# Patient Record
Sex: Female | Born: 1945 | Race: White | Hispanic: No | Marital: Married | Smoking: Never smoker
Health system: Southern US, Community
[De-identification: ages and names within clinical notes are randomized; demographics above are authoritative.]

## PROBLEM LIST (undated history)

## (undated) DIAGNOSIS — I639 Cerebral infarction, unspecified: Secondary | ICD-10-CM

## (undated) HISTORY — DX: Cerebral infarction, unspecified: I63.9

---

## 2019-05-24 ENCOUNTER — Inpatient Hospital Stay
Admission: EM | Admit: 2019-05-24 | Discharge: 2019-05-27 | DRG: 065 | Disposition: A | Discharge status: Home / Self Care | Payer: Medicare Other | Attending: Internal Medicine | Admitting: Internal Medicine

## 2019-05-24 ENCOUNTER — Emergency Department: Payer: Medicare Other

## 2019-05-24 ENCOUNTER — Inpatient Hospital Stay: Payer: Medicare Other

## 2019-05-24 ENCOUNTER — Other Ambulatory Visit: Payer: Self-pay

## 2019-05-24 DIAGNOSIS — Z1159 Encounter for screening for other viral diseases: Secondary | ICD-10-CM

## 2019-05-24 DIAGNOSIS — I160 Hypertensive urgency: Secondary | ICD-10-CM | POA: Diagnosis present

## 2019-05-24 DIAGNOSIS — I6381 Other cerebral infarction due to occlusion or stenosis of small artery: Principal | ICD-10-CM | POA: Diagnosis present

## 2019-05-24 DIAGNOSIS — R531 Weakness: Secondary | ICD-10-CM | POA: Diagnosis present

## 2019-05-24 DIAGNOSIS — I119 Hypertensive heart disease without heart failure: Secondary | ICD-10-CM | POA: Diagnosis present

## 2019-05-24 DIAGNOSIS — I739 Peripheral vascular disease, unspecified: Secondary | ICD-10-CM | POA: Diagnosis present

## 2019-05-24 DIAGNOSIS — R739 Hyperglycemia, unspecified: Secondary | ICD-10-CM | POA: Diagnosis present

## 2019-05-24 DIAGNOSIS — I639 Cerebral infarction, unspecified: Secondary | ICD-10-CM

## 2019-05-24 DIAGNOSIS — R29700 NIHSS score 0: Secondary | ICD-10-CM | POA: Diagnosis present

## 2019-05-24 DIAGNOSIS — Z79899 Other long term (current) drug therapy: Secondary | ICD-10-CM | POA: Diagnosis not present

## 2019-05-24 DIAGNOSIS — Z833 Family history of diabetes mellitus: Secondary | ICD-10-CM | POA: Diagnosis not present

## 2019-05-24 DIAGNOSIS — R9431 Abnormal electrocardiogram [ECG] [EKG]: Secondary | ICD-10-CM | POA: Diagnosis not present

## 2019-05-24 DIAGNOSIS — Z8249 Family history of ischemic heart disease and other diseases of the circulatory system: Secondary | ICD-10-CM

## 2019-05-24 DIAGNOSIS — I10 Essential (primary) hypertension: Secondary | ICD-10-CM | POA: Diagnosis present

## 2019-05-24 DIAGNOSIS — G459 Transient cerebral ischemic attack, unspecified: Secondary | ICD-10-CM

## 2019-05-24 DIAGNOSIS — N390 Urinary tract infection, site not specified: Secondary | ICD-10-CM | POA: Diagnosis present

## 2019-05-24 DIAGNOSIS — I447 Left bundle-branch block, unspecified: Secondary | ICD-10-CM | POA: Diagnosis present

## 2019-05-24 LAB — CBC WITH DIFFERENTIAL/PLATELET
Abs Immature Granulocytes: 0.04 10*3/uL (ref 0.00–0.07)
Basophils Absolute: 0.1 10*3/uL (ref 0.0–0.1)
Basophils Relative: 1 %
Eosinophils Absolute: 0.1 10*3/uL (ref 0.0–0.5)
Eosinophils Relative: 1 %
HCT: 47.3 % — ABNORMAL HIGH (ref 36.0–46.0)
Hemoglobin: 15 g/dL (ref 12.0–15.0)
Immature Granulocytes: 0 %
Lymphocytes Relative: 15 %
Lymphs Abs: 1.8 10*3/uL (ref 0.7–4.0)
MCH: 29.6 pg (ref 26.0–34.0)
MCHC: 31.7 g/dL (ref 30.0–36.0)
MCV: 93.3 fL (ref 80.0–100.0)
Monocytes Absolute: 0.8 10*3/uL (ref 0.1–1.0)
Monocytes Relative: 6 %
Neutro Abs: 9.6 10*3/uL — ABNORMAL HIGH (ref 1.7–7.7)
Neutrophils Relative %: 77 %
Platelets: 170 10*3/uL (ref 150–400)
RBC: 5.07 MIL/uL (ref 3.87–5.11)
RDW: 12.9 % (ref 11.5–15.5)
WBC: 12.3 10*3/uL — ABNORMAL HIGH (ref 4.0–10.5)
nRBC: 0 % (ref 0.0–0.2)

## 2019-05-24 LAB — COMPREHENSIVE METABOLIC PANEL
ALT: 12 U/L (ref 0–44)
ALT: 12 U/L (ref 0–44)
AST: 18 U/L (ref 15–41)
AST: 15 U/L (ref 15–41)
Albumin: 4.2 g/dL (ref 3.5–5.0)
Albumin: 3.6 g/dL (ref 3.5–5.0)
Alkaline Phosphatase: 81 U/L (ref 38–126)
Alkaline Phosphatase: 73 U/L (ref 38–126)
Anion gap: 13 (ref 5–15)
Anion gap: 12 (ref 5–15)
BUN: 23 mg/dL (ref 8–23)
BUN: 20 mg/dL (ref 8–23)
CO2: 22 mmol/L (ref 22–32)
CO2: 21 mmol/L — ABNORMAL LOW (ref 22–32)
Calcium: 9.1 mg/dL (ref 8.9–10.3)
Calcium: 8.7 mg/dL — ABNORMAL LOW (ref 8.9–10.3)
Chloride: 104 mmol/L (ref 98–111)
Chloride: 106 mmol/L (ref 98–111)
Creatinine, Ser: 0.84 mg/dL (ref 0.44–1.00)
Creatinine, Ser: 0.81 mg/dL (ref 0.44–1.00)
GFR calc Af Amer: >60 mL/min (ref >60)
GFR calc Af Amer: >60 mL/min (ref >60)
GFR calc non Af Amer: >60 mL/min (ref >60)
GFR calc non Af Amer: >60 mL/min (ref >60)
Glucose, Bld: 116 mg/dL — ABNORMAL HIGH (ref 70–99)
Glucose, Bld: 112 mg/dL — ABNORMAL HIGH (ref 70–99)
Potassium: 3.9 mmol/L (ref 3.5–5.1)
Potassium: 3.9 mmol/L (ref 3.5–5.1)
Sodium: 139 mmol/L (ref 135–145)
Sodium: 139 mmol/L (ref 135–145)
Total Bilirubin: 0.8 mg/dL (ref 0.3–1.2)
Total Bilirubin: 0.7 mg/dL (ref 0.3–1.2)
Total Protein: 8.1 g/dL (ref 6.5–8.1)
Total Protein: 7.1 g/dL (ref 6.5–8.1)

## 2019-05-24 LAB — LIPID PANEL
Cholesterol: 176 mg/dL (ref 0–200)
HDL: 48 mg/dL (ref >40)
LDL Cholesterol: 110 mg/dL — ABNORMAL HIGH (ref 0–99)
Total CHOL/HDL Ratio: 3.7 RATIO
Triglycerides: 92 mg/dL (ref <150)
VLDL: 18 mg/dL (ref 0–40)

## 2019-05-24 LAB — URINALYSIS, COMPLETE (UACMP) WITH MICROSCOPIC
Bacteria, UA: NONE SEEN
Bilirubin Urine: NEGATIVE
Glucose, UA: NEGATIVE mg/dL
Ketones, ur: NEGATIVE mg/dL
Nitrite: NEGATIVE
Protein, ur: NEGATIVE mg/dL
Specific Gravity, Urine: 1.017 (ref 1.005–1.030)
pH: 5 (ref 5.0–8.0)

## 2019-05-24 LAB — PROTIME-INR
INR: 0.9 (ref 0.8–1.2)
Prothrombin Time: 12.3 s (ref 11.4–15.2)

## 2019-05-24 LAB — TSH: TSH: 1.448 u[IU]/mL (ref 0.350–4.500)

## 2019-05-24 LAB — TROPONIN I (HIGH SENSITIVITY)
Troponin I (High Sensitivity): 10 ng/L (ref <18)
Troponin I (High Sensitivity): 15 ng/L (ref <18)
Troponin I (High Sensitivity): 15 ng/L (ref <18)

## 2019-05-24 LAB — APTT: aPTT: 31 seconds (ref 24–36)

## 2019-05-24 MED ORDER — ASPIRIN EC 81 MG PO TBEC
81.0000 mg | DELAYED_RELEASE_TABLET | Freq: Every day | ORAL | Status: DC
  Administered 2019-05-24 – 2019-05-27 (×4): 81 mg via ORAL
  Filled 2019-05-24 (×4): qty 1

## 2019-05-24 MED ORDER — ENOXAPARIN SODIUM 40 MG/0.4ML ~~LOC~~ SOLN
40.0000 mg | SUBCUTANEOUS | Status: DC
  Administered 2019-05-24 – 2019-05-27 (×4): 40 mg via SUBCUTANEOUS
  Filled 2019-05-24 (×4): qty 0.4

## 2019-05-24 MED ORDER — SODIUM CHLORIDE 0.9% FLUSH: 3.0000 mL | INTRAVENOUS | Status: DC | PRN

## 2019-05-24 MED ORDER — ACETAMINOPHEN 650 MG RE SUPP: 650.0000 mg | Freq: Four times a day (QID) | RECTAL | Status: DC | PRN

## 2019-05-24 MED ORDER — SODIUM CHLORIDE 0.9% FLUSH
3.0000 mL | Freq: Two times a day (BID) | INTRAVENOUS | Status: DC
  Administered 2019-05-24 – 2019-05-27 (×5): 3 mL via INTRAVENOUS

## 2019-05-24 MED ORDER — ACETAMINOPHEN 325 MG PO TABS: 650.0000 mg | ORAL_TABLET | Freq: Four times a day (QID) | ORAL | Status: DC | PRN

## 2019-05-24 MED ORDER — LORAZEPAM 2 MG/ML IJ SOLN
1.0000 mg | Freq: Four times a day (QID) | INTRAMUSCULAR | Status: DC | PRN
  Administered 2019-05-24: 1 mg via INTRAVENOUS
  Filled 2019-05-24: qty 1

## 2019-05-24 MED ORDER — ONDANSETRON HCL 4 MG PO TABS: 4.0000 mg | ORAL_TABLET | Freq: Four times a day (QID) | ORAL | Status: DC | PRN

## 2019-05-24 MED ORDER — LORAZEPAM 2 MG/ML IJ SOLN
1.0000 mg | Freq: Once | INTRAMUSCULAR | Status: AC
  Administered 2019-05-24: 1 mg via INTRAVENOUS
  Filled 2019-05-24: qty 1

## 2019-05-24 MED ORDER — SODIUM CHLORIDE 0.9 % IV SOLN: 250.0000 mL | INTRAVENOUS | Status: DC | PRN

## 2019-05-24 MED ORDER — SODIUM CHLORIDE 0.9 % IV SOLN
1.0000 g | INTRAVENOUS | Status: DC
  Administered 2019-05-24: 1 g via INTRAVENOUS
  Filled 2019-05-24 (×2): qty 10

## 2019-05-24 MED ORDER — ONDANSETRON HCL 4 MG/2ML IJ SOLN: 4.0000 mg | Freq: Four times a day (QID) | INTRAMUSCULAR | Status: DC | PRN

## 2019-05-24 MED ORDER — HYDRALAZINE HCL 20 MG/ML IJ SOLN
10.0000 mg | Freq: Four times a day (QID) | INTRAMUSCULAR | Status: DC | PRN
  Filled 2019-05-24: qty 1

## 2019-05-24 MED ORDER — METOPROLOL TARTRATE 25 MG PO TABS
25.0000 mg | ORAL_TABLET | Freq: Two times a day (BID) | ORAL | Status: DC
  Administered 2019-05-24 – 2019-05-27 (×6): 25 mg via ORAL
  Filled 2019-05-24 (×6): qty 1

## 2019-05-24 MED ORDER — LABETALOL HCL 5 MG/ML IV SOLN
5.0000 mg | Freq: Once | INTRAVENOUS | Status: AC
  Administered 2019-05-24: 5 mg via INTRAVENOUS
  Filled 2019-05-24: qty 4

## 2019-05-24 MED ORDER — POLYETHYLENE GLYCOL 3350 17 G PO PACK: 17.0000 g | PACK | Freq: Every day | ORAL | Status: DC | PRN

## 2019-05-24 MED ORDER — HYDROCODONE-ACETAMINOPHEN 5-325 MG PO TABS: 1.0000 | ORAL_TABLET | ORAL | Status: DC | PRN

## 2019-05-24 NOTE — Progress Notes (Signed)
Family Meeting Note  Advance Directive:no  Today a meeting took place with the Patient.   The following clinical team members were present during this meeting:MD  The following were discussed:Patient's diagnosis: LBBB Weakness Accelerated HTN  , Patient's progosis: > 12 months and Goals for treatment: Full Code  Additional follow-up to be provided: chaplain consult ot create AD  Time spent during discussion:16 minutes  Bettey Costa, MD

## 2019-05-24 NOTE — ED Notes (Signed)
ED TO INPATIENT HANDOFF REPORT  ED Nurse Name and Phone #:    S Name/Age/Gender Roberta Nunez 73 y.o. female Room/Bed: ED16A/ED16A  Code Status   Code Status: Full Code  Home/SNF/Other Home Patient oriented to: self, place, time and situation Is this baseline? Yes   Triage Complete: Triage complete  Chief Complaint hypertension;weakness  Triage Note Pt comes via POV from home with c/o hypertension and weakness. Pt states that this past Friday night she had an episode of weakness to right leg and right arm. Pt states it then subsided.  Pt states that Saturday she felt weak and has since just not felt right. Pt states she was told by PCP to come to walk in and then was sent here from walk-in clinic.  Pt states she has just been feeling so weak.  Pt is alert and oriented. Mini neuro negative. No slurred speech, headache.   Allergies Not on File  Level of Care/Admitting Diagnosis ED Disposition    ED Disposition Condition [facility] Area: [place] [[id number]]  Level of Care: Telemetry [5]  Covid Evaluation: N/A  Diagnosis: HTN (hypertension) [878676]  Admitting Physician: Bettey Costa [[id number]]  Attending Physician: MODY, Ulice Bold [[id number]]  Estimated length of stay: past midnight tomorrow  Certification:: I certify this patient will need inpatient services for at least 2 midnights  PT Class (Do Not Modify): Inpatient [101]  PT Acc Code (Do Not Modify): Private [1]       B Medical/Surgery History History reviewed. No pertinent past medical history. History reviewed. No pertinent surgical history.   A IV Location/Drains/Wounds Patient Lines/Drains/Airways Status   Active Line/Drains/Airways    Name:   Placement date:   Placement time:   Site:   Days:   Peripheral IV 05/24/19 Right Antecubital   05/24/19    1507    Antecubital   less than 1          Intake/Output Last 24 hours No intake or output data in the 24 hours  ending 05/24/19 1727  Labs/Imaging Results for orders placed or performed during the hospital encounter of 05/24/19 (from the past 48 hour(s))  Troponin I (High Sensitivity)     Status: None   Collection Time: 05/24/19  3:01 PM  Result Value Ref Range   Troponin I (High Sensitivity) 10 <18 ng/L    Comment: (NOTE) Elevated high sensitivity troponin I (hsTnI) values and significant  changes across serial measurements may suggest ACS but many other  chronic and acute conditions are known to elevate hsTnI results.  Refer to the "Links" section for chest pain algorithms and additional  guidance. Performed at [facility], [address] [place], [place] [address]   Comprehensive metabolic panel     Status: Abnormal   Collection Time: 05/24/19  3:01 PM  Result Value Ref Range   Sodium 139 135 - 145 mmol/L   Potassium 3.9 3.5 - 5.1 mmol/L   Chloride 104 98 - 111 mmol/L   CO2 22 22 - 32 mmol/L   Glucose, Bld 116 (H) 70 - 99 mg/dL   BUN 23 8 - 23 mg/dL   Creatinine, Ser 0.84 0.44 - 1.00 mg/dL   Calcium 9.1 8.9 - 10.3 mg/dL   Total Protein 8.1 6.5 - 8.1 g/dL   Albumin 4.2 3.5 - 5.0 g/dL   AST 18 15 - 41 U/L   ALT 12 0 - 44 U/L   Alkaline Phosphatase 81 38 - 126  U/L   Total Bilirubin 0.8 0.3 - 1.2 mg/dL   GFR calc non Af Amer >60 >60 mL/min   GFR calc Af Amer >60 >60 mL/min   Anion gap 13 5 - 15    Comment: Performed at [facility], [address] [place], [place] [address]  CBC with Differential     Status: Abnormal   Collection Time: 05/24/19  3:01 PM  Result Value Ref Range   WBC 12.3 (H) 4.0 - 10.5 K/uL   RBC 5.07 3.87 - 5.11 MIL/uL   Hemoglobin 15.0 12.0 - 15.0 g/dL   HCT 19.147.3 (H) 47.836.0 - 29.546.0 %   MCV 93.3 80.0 - 100.0 fL   MCH 29.6 26.0 - 34.0 pg   MCHC 31.7 30.0 - 36.0 g/dL   RDW 62.112.9 30.811.5 - 65.715.5 %   Platelets 170 150 - 400 K/uL   nRBC 0.0 0.0 - 0.2 %   Neutrophils Relative % 77 %   Neutro Abs 9.6 (H) 1.7 - 7.7 K/uL   Lymphocytes Relative 15 %    Lymphs Abs 1.8 0.7 - 4.0 K/uL   Monocytes Relative 6 %   Monocytes Absolute 0.8 0.1 - 1.0 K/uL   Eosinophils Relative 1 %   Eosinophils Absolute 0.1 0.0 - 0.5 K/uL   Basophils Relative 1 %   Basophils Absolute 0.1 0.0 - 0.1 K/uL   Immature Granulocytes 0 %   Abs Immature Granulocytes 0.04 0.00 - 0.07 K/uL    Comment: Performed at [facility], [address] [place], [place] [address]  Urinalysis, Complete w Microscopic     Status: Abnormal   Collection Time: 05/24/19  3:01 PM  Result Value Ref Range   Color, Urine YELLOW (A) YELLOW   APPearance HAZY (A) CLEAR   Specific Gravity, Urine 1.017 1.005 - 1.030   pH 5.0 5.0 - 8.0   Glucose, UA NEGATIVE NEGATIVE mg/dL   Hgb urine dipstick SMALL (A) NEGATIVE   Bilirubin Urine NEGATIVE NEGATIVE   Ketones, ur NEGATIVE NEGATIVE mg/dL   Protein, ur NEGATIVE NEGATIVE mg/dL   Nitrite NEGATIVE NEGATIVE   Leukocytes,Ua MODERATE (A) NEGATIVE   RBC / HPF 0-5 0 - 5 RBC/hpf   WBC, UA 21-50 0 - 5 WBC/hpf   Bacteria, UA NONE SEEN NONE SEEN   Squamous Epithelial / LPF 6-10 0 - 5   Mucus PRESENT     Comment: Performed at [facility], [address] [place], [place] [address]  Protime-INR     Status: None   Collection Time: 05/24/19  3:01 PM  Result Value Ref Range   Prothrombin Time 12.3 11.4 - 15.2 seconds   INR 0.9 0.8 - 1.2    Comment: (NOTE) INR goal varies based on device and disease states. Performed at [facility], [address] [place], [place] [address]   APTT     Status: None   Collection Time: 05/24/19  3:01 PM  Result Value Ref Range   aPTT 31 24 - 36 seconds    Comment: Performed at [facility], [address] [place], [place] [address]   Dg Chest 2 View  Result Date: 05/24/2019 CLINICAL DATA:  Weakness. EXAM: CHEST - 2 VIEW COMPARISON:  None. FINDINGS: The heart size and mediastinal contours are within normal limits. Both lungs are clear. The visualized skeletal structures are  unremarkable. IMPRESSION: No active cardiopulmonary disease. Electronically Signed   By: Lupita RaiderJames  Green Jr M.D.   On: 05/24/2019 16:23  Ct Head Wo Contrast  Result Date: 05/24/2019 CLINICAL DATA:  Right arm and leg heaviness x 5 days EXAM: CT HEAD WITHOUT CONTRAST TECHNIQUE: Contiguous axial images were obtained from the base of the skull through the vertex without intravenous contrast. COMPARISON:  None. FINDINGS: Brain: No evidence of acute infarction, hemorrhage, hydrocephalus, extra-axial collection or mass lesion/mass effect. Periventricular white matter low attenuation as can be seen with microvascular disease. Vascular: No hyperdense vessel or unexpected calcification. Skull: No osseous abnormality. Sinuses/Orbits: Visualized paranasal sinuses are clear. Visualized mastoid sinuses are clear. Visualized orbits demonstrate no focal abnormality. Other: None IMPRESSION: No acute intracranial pathology. Electronically Signed   By: Elige KoHetal  Patel   On: 05/24/2019 16:52    Pending Labs Unresulted Labs (From admission, onward)    Start     Ordered   05/31/19 0500  Creatinine, serum  (enoxaparin (LOVENOX)    CrCl >/= 30 ml/min)  Weekly,   STAT    Comments: while on enoxaparin therapy    05/24/19 1711   05/25/19 0500  Basic metabolic panel  Tomorrow morning,   STAT     05/24/19 1711   05/24/19 1712  CBC  (enoxaparin (LOVENOX)    CrCl >/= 30 ml/min)  Once,   STAT    Comments: Baseline for enoxaparin therapy IF NOT ALREADY DRAWN.  Notify MD if PLT < 100 K.    05/24/19 1711   05/24/19 1712  Creatinine, serum  (enoxaparin (LOVENOX)    CrCl >/= 30 ml/min)  Once,   STAT    Comments: Baseline for enoxaparin therapy IF NOT ALREADY DRAWN.    05/24/19 1711   05/24/19 1712  Lipid panel  Once,   STAT     05/24/19 1711   05/24/19 1712  TSH  Once,   STAT     05/24/19 1711   05/24/19 1552  Novel Coronavirus,NAA,(SEND-OUT TO REF LAB - TAT 24-48 hrs); Hosp Order  (Asymptomatic Patients Labs)  Once,   STAT     Question:  Rule Out  Answer:  Yes   05/24/19 1552   05/24/19 1545  Urine culture  ONCE - STAT,   STAT     05/24/19 1545   05/24/19 1500  CBC with Differential/Platelet  Once,   R     05/24/19 1500   05/24/19 1500  Comprehensive metabolic panel  Once,   R     14/78/2906/30/20 1500   05/24/19 1500  Novel Coronavirus, NAA (hospital order; send-out to ref lab)  Once,   R     05/24/19 1500   05/24/19 1500  Protime-INR  Once,   R     05/24/19 1500   05/24/19 1500  APTT  Once,   R     05/24/19 1500   05/24/19 1500  Troponin I (High Sensitivity)  Once,   R     05/24/19 1500   05/24/19 1500  Urinalysis, Routine w reflex microscopic  Once,   R     05/24/19 1500   05/24/19 1500  Urine Culture  Once,   R     05/24/19 1500   05/24/19 1441  Troponin I (High Sensitivity)  STAT Now then every 2 hours,   STAT    Question Answer Comment  Indication Other   Specify indication weakness      05/24/19 1441          Vitals/Pain Today's Vitals   05/24/19 1530 05/24/19 1600 05/24/19 1630 05/24/19 1700  BP: (!) 186/92 (!) 178/91 Marland Kitchen(!)  170/81 (!) 166/91  Pulse: (!) 109 (!) 102 (!) 101 (!) 101  Resp: 19 (!) 21 19 20   Temp:      TempSrc:      SpO2: 96% 95% 96% 95%  Weight:      Height:      PainSc:        Isolation Precautions No active isolations  Medications Medications  metoprolol tartrate (LOPRESSOR) tablet 25 mg (has no administration in time range)  hydrALAZINE (APRESOLINE) injection 10 mg (has no administration in time range)  enoxaparin (LOVENOX) injection 40 mg (has no administration in time range)  sodium chloride flush (NS) 0.9 % injection 3 mL (has no administration in time range)  0.9 %  sodium chloride infusion (has no administration in time range)  sodium chloride flush (NS) 0.9 % injection 3 mL (has no administration in time range)  acetaminophen (TYLENOL) tablet 650 mg (has no administration in time range)    Or  acetaminophen (TYLENOL) suppository 650 mg (has no administration  in time range)  HYDROcodone-acetaminophen (NORCO/VICODIN) 5-325 MG per tablet 1-2 tablet (has no administration in time range)  polyethylene glycol (MIRALAX / GLYCOLAX) packet 17 g (has no administration in time range)  ondansetron (ZOFRAN) tablet 4 mg (has no administration in time range)    Or  ondansetron (ZOFRAN) injection 4 mg (has no administration in time range)  aspirin EC tablet 81 mg (has no administration in time range)  LORazepam (ATIVAN) injection 1 mg (has no administration in time range)  cefTRIAXone (ROCEPHIN) 1 g in sodium chloride 0.9 % 100 mL IVPB (1 g Intravenous New Bag/Given 05/24/19 1703)  labetalol (NORMODYNE) injection 5 mg (5 mg Intravenous Given 05/24/19 1534)  LORazepam (ATIVAN) injection 1 mg (1 mg Intravenous Given 05/24/19 1556)    Mobility walks Low fall risk   Focused Assessments Neuro Assessment Handoff:  Swallow screen pass? Yes  Cardiac Rhythm: Sinus tachycardia NIH Stroke Scale ( + Modified Stroke Scale Criteria)  LOC Questions (1b. )   +: Answers both questions correctly LOC Commands (1c. )   + : Performs both tasks correctly Best Gaze (2. )  +: Normal Visual (3. )  +: No visual loss Motor Arm, Left (5a. )   +: No drift Motor Arm, Right (5b. )   +: No drift Motor Leg, Left (6a. )   +: No drift Motor Leg, Right (6b. )   +: No drift Sensory (8. )   +: Normal, no sensory loss Best Language (9. )   +: No aphasia Extinction/Inattention (11.)   +: No Abnormality Modified SS Total  +: 0     Neuro Assessment:   Neuro Checks:      Last Documented NIHSS Modified Score: 0 (05/24/19 1546) Has TPA been given? No   If patient is a Neuro Trauma and patient is going to OR before floor call report to 4N Charge nurse: [phone] or [phone]     R Recommendations: See Admitting Provider Note  Report given to:   Additional Notes:

## 2019-05-24 NOTE — ED Notes (Signed)
Patient reports he pressure is high, reports has never taken b/p meds nor was she ever perscribed medications from her pcp. States has pressure was always high with all her visits to pcp.

## 2019-05-24 NOTE — ED Notes (Signed)
  EKG taken to MD by Dorian and informed to bring pt back to room 16. No orders or lab completed at this time. Dorian to inform RN

## 2019-05-24 NOTE — ED Notes (Signed)
DR. Lisbeth Renshaw at bedside to assess and discuss plan of care during admission.

## 2019-05-24 NOTE — H&P (Signed)
[place] [facility] [place] NAME: Reann Dobias    MR#:  [id number]  DATE OF BIRTH:  21-Jun-1946  DATE OF ADMISSION:  05/24/2019  PRIMARY CARE PHYSICIAN: Patient, No Pcp Per   REQUESTING/REFERRING PHYSICIAN: dr Corky Downs  CHIEF COMPLAINT:   weakness HISTORY OF PRESENT ILLNESS:  Dicie Edelen  is a 73 y.o. female with no past medical history who presents from home due to elevated blood pressure and generalized weakness.  Patient reports that Friday she had an episode of right leg weakness and right arm weakness.  It subsided fairly shortly approximately 5 minutes.  She had no other focal neurological deficits.  Patient reports that she just did not feel well over the weekend.  She presents today due to the symptoms.  In the emergency room she is noted to have a new left bundle branch block and elevated blood pressure systolic in the 151V.  She reports she does not have a history of hypertension nor has she taken any medications. She denies fever, chills, chest pain, PND or shortness of breath. PAST MEDICAL HISTORY:  None  PAST SURGICAL HISTORY:  None  SOCIAL HISTORY:  Tobacco or EtOH abuse  FAMILY HISTORY:  Positive hypertension  DRUG ALLERGIES:  Drug allergies  REVIEW OF SYSTEMS:   Review of Systems  Constitutional: Positive for malaise/fatigue. Negative for chills and fever.  HENT: Negative.  Negative for ear discharge, ear pain, hearing loss, nosebleeds and sore throat.   Eyes: Negative.  Negative for blurred vision and pain.  Respiratory: Negative.  Negative for cough, hemoptysis, shortness of breath and wheezing.   Cardiovascular: Negative.  Negative for chest pain, palpitations and leg swelling.  Gastrointestinal: Negative.  Negative for abdominal pain, blood in stool, diarrhea, nausea and vomiting.  Genitourinary: Negative.  Negative for dysuria.  Musculoskeletal: Negative.  Negative for back pain.  Skin: Negative.   Neurological:  Positive for focal weakness. Negative for dizziness, tremors, speech change, seizures and headaches.  Endo/Heme/Allergies: Negative.  Does not bruise/bleed easily.  Psychiatric/Behavioral: Negative.  Negative for depression, hallucinations and suicidal ideas.    MEDICATIONS AT HOME:   Prior to Admission medications   Not on File      VITAL SIGNS:  Blood pressure (!) 178/91, pulse (!) 102, temperature 99.5 F (37.5 C), temperature source Oral, resp. rate (!) 21, height 5' (1.524 m), weight 67.6 kg, SpO2 95 %.  PHYSICAL EXAMINATION:   Physical Exam Constitutional:      General: She is not in acute distress. HENT:     Head: Normocephalic.  Eyes:     General: No scleral icterus. Neck:     Musculoskeletal: Normal range of motion and neck supple.     Vascular: No JVD.     Trachea: No tracheal deviation.  Cardiovascular:     Rate and Rhythm: Normal rate and regular rhythm.     Heart sounds: Normal heart sounds. No murmur. No friction rub. No gallop.   Pulmonary:     Effort: Pulmonary effort is normal. No respiratory distress.     Breath sounds: Normal breath sounds. No wheezing or rales.  Chest:     Chest wall: No tenderness.  Abdominal:     General: Bowel sounds are normal. There is no distension.     Palpations: Abdomen is soft. There is no mass.     Tenderness: There is no abdominal tenderness. There is no guarding or rebound.  Musculoskeletal: Normal range of motion.  Skin:  General: Skin is warm.     Findings: No erythema or rash.  Neurological:     General: No focal deficit present.     Mental Status: She is alert and oriented to person, place, and time.  Psychiatric:        Mood and Affect: Mood normal.        Behavior: Behavior normal.        Thought Content: Thought content normal.        Judgment: Judgment normal.       LABORATORY PANEL:   CBC Recent Labs  Lab 05/24/19 1501  WBC 12.3*  HGB 15.0  HCT 47.3*  PLT 170    ------------------------------------------------------------------------------------------------------------------  Chemistries  Recent Labs  Lab 05/24/19 1501  NA 139  K 3.9  CL 104  CO2 22  GLUCOSE 116*  BUN 23  CREATININE 0.84  CALCIUM 9.1  AST 18  ALT 12  ALKPHOS 81  BILITOT 0.8   ------------------------------------------------------------------------------------------------------------------  Cardiac Enzymes No results for input(s): TROPONINI in the last 168 hours. ------------------------------------------------------------------------------------------------------------------  RADIOLOGY:  Dg Chest 2 View  Result Date: 05/24/2019 CLINICAL DATA:  Weakness. EXAM: CHEST - 2 VIEW COMPARISON:  None. FINDINGS: The heart size and mediastinal contours are within normal limits. Both lungs are clear. The visualized skeletal structures are unremarkable. IMPRESSION: No active cardiopulmonary disease. Electronically Signed   By: Lupita RaiderJames  Green Jr M.D.   On: 05/24/2019 16:23    EKG:  Sinus tachycardia left bundle branch block no ST elevation  IMPRESSION AND PLAN:   73 year old female with no past medical history who presents with generalized weakness.  1.  Accelerated hypertension: Start metoprolol 25 p.o. twice daily Order echocardiogram PRN hydralazine Monitor blood pressure TSH 2.  New left bundle branch block[facility] via epic Monitor in telemetry Follow troponins  3.  Hyperglycemia: Repeat BMP in a.m.  If still elevated then consider A1c  4.  UTI: Start Rocephin and follow-up on urine culture.  5.  Transient Right leg and arm weakness which has resolved: This is likely due to elevated blood pressure however I will order MRI of the brain to evaluate for underlying CVA and carotid Doppler.   All the records are reviewed and case discussed with ED provider. Management plans discussed with the patient and she is in agreement  CODE  STATUS: Full  TOTAL TIME TAKING CARE OF THIS PATIENT: 42 minutes.    Adrian SaranSital Annise Boran M.D on 05/24/2019 at 4:50 PM  Between 7am to 6pm - Pager - [phone]  After 6pm go to [email or web address] - password [email or web address]  [facility]  Office  [phone]  CC: Primary care physician; Patient, No Pcp Per

## 2019-05-24 NOTE — ED Provider Notes (Signed)
[facility] Emergency Department Provider Note  ____________________________________________   First MD Initiated Contact with Patient 05/24/19 1503     (approximate)  I have reviewed the triage vital signs and the nursing notes.   HISTORY  Chief Complaint Weakness and Hypertension   HPI Roberta Nunez is a 73 y.o. female who presents to the emergency department for treatment and evaluation of hypertension, weakness and generalized malaise. Symptoms started 5 days ago. She states that for a few hours her right arm and leg felt "heavy."  This lasted for a few hours and then resolved. Since that time she has just felt tired and weak. She has had a headache in the mornings off and on for quite a while, but they typically resolve with tylenol and a shower. She denies chest pain or shortness of breath.       History reviewed. No pertinent past medical history.  Patient Active Problem List   Diagnosis Date Noted  . HTN (hypertension) 05/24/2019    History reviewed. No pertinent surgical history.  Prior to Admission medications   Medication Sig Start Date End Date Taking? Authorizing Provider  acetaminophen (TYLENOL) 325 MG tablet Take 650 mg by mouth every 6 (six) hours as needed.   Yes [provider]    Allergies Patient has no allergy information on record.  No family history on file.  Social History Social History   Tobacco Use  . Smoking status: Never Smoker  . Smokeless tobacco: Never Used  Substance Use Topics  . Alcohol use: Not on file  . Drug use: Not on file    Review of Systems  Constitutional: No fever/chills. Positive for generalized weakness. Eyes: No visual changes. ENT: No sore throat. Cardiovascular: Denies chest pain. Respiratory: Denies shortness of breath. Gastrointestinal: No abdominal pain.  No nausea, no vomiting.  No diarrhea.  No constipation. Genitourinary: Negative for dysuria. Musculoskeletal:  Negative for back pain. Skin: Negative for rash. Neurological: Negative for headaches, focal weakness or numbness. ____________________________________________   PHYSICAL EXAM:  VITAL SIGNS: ED Triage Vitals  Enc Vitals Group     BP 05/24/19 1402 (!) 205/92     Pulse Rate 05/24/19 1402 (!) 116     Resp 05/24/19 1402 18     Temp 05/24/19 1402 99.5 F (37.5 C)     Temp Source 05/24/19 1402 Oral     SpO2 05/24/19 1402 95 %     Weight 05/24/19 1402 149 lb (67.6 kg)     Height 05/24/19 1402 5' (1.524 m)     Head Circumference --      Peak Flow --      Pain Score 05/24/19 1507 0     Pain Loc --      Pain Edu? --      Excl. in [place]? --     Constitutional: Alert and oriented. Well appearing and in no acute distress. Eyes: Conjunctivae are normal. PERRL. EOMI. Head: Atraumatic. Nose: No congestion/rhinnorhea. Mouth/Throat: Mucous membranes are moist.  Oropharynx non-erythematous. Neck: No stridor.   Cardiovascular: Normal rate, regular rhythm. Grossly normal heart sounds.  Good peripheral circulation. Respiratory: Normal respiratory effort.  No retractions. Lungs CTAB. Gastrointestinal: Soft and nontender. No distention. No abdominal bruits. No CVA tenderness. Musculoskeletal: No lower extremity tenderness nor edema.  No joint effusions. Neurologic:  Normal speech and language. No gross focal neurologic deficits are appreciated. Tongue protrudes midline, equal shoulder shrug, no arm drift, able to perform straight leg raise without assistance.  No gait instability. Skin:  Skin is warm, dry and intact. No rash noted. Psychiatric: Mood and affect are normal. Speech and behavior are normal.  ____________________________________________   LABS (all labs ordered are listed, but only abnormal results are displayed)  Labs Reviewed  COMPREHENSIVE METABOLIC PANEL - Abnormal; Notable for the following components:      Result Value   Glucose, Bld 116 (*)    All other components within  normal limits  CBC WITH DIFFERENTIAL/PLATELET - Abnormal; Notable for the following components:   WBC 12.3 (*)    HCT 47.3 (*)    Neutro Abs 9.6 (*)    All other components within normal limits  URINALYSIS, COMPLETE (UACMP) WITH MICROSCOPIC - Abnormal; Notable for the following components:   Color, Urine YELLOW (*)    APPearance HAZY (*)    Hgb urine dipstick SMALL (*)    Leukocytes,Ua MODERATE (*)    All other components within normal limits  COMPREHENSIVE METABOLIC PANEL - Abnormal; Notable for the following components:   CO2 21 (*)    Glucose, Bld 112 (*)    Calcium 8.7 (*)    All other components within normal limits  LIPID PANEL - Abnormal; Notable for the following components:   LDL Cholesterol 110 (*)    All other components within normal limits  URINE CULTURE  NOVEL CORONAVIRUS, NAA (HOSPITAL ORDER, SEND-OUT TO REF LAB)  NOVEL CORONAVIRUS, NAA (HOSPITAL ORDER, SEND-OUT TO REF LAB)  URINE CULTURE  TROPONIN I (HIGH SENSITIVITY)  TROPONIN I (HIGH SENSITIVITY)  PROTIME-INR  APTT  TROPONIN I (HIGH SENSITIVITY)  TSH  CBC WITH DIFFERENTIAL/PLATELET  BASIC METABOLIC PANEL   ____________________________________________  EKG  ED ECG REPORT I, Anani Gu, FNP-BC personally viewed and interpreted this ECG.   Date: 05/24/2019  EKG Time: 1408  Rate: 115  Rhythm: sinus tachycardia  Axis: left  Intervals:left bundle branch block  ST&T Change: LBBB, no ST elevation.  ____________________________________________  RADIOLOGY  ED MD interpretation:    CT Head without contrast is negative for acute abnormalities.  Chest x-ray is negative for acute cardiopulmonary abnormality.  Official radiology report(s): Dg Chest 2 View  Result Date: 05/24/2019 CLINICAL DATA:  Weakness. EXAM: CHEST - 2 VIEW COMPARISON:  None. FINDINGS: The heart size and mediastinal contours are within normal limits. Both lungs are clear. The visualized skeletal structures are unremarkable.  IMPRESSION: No active cardiopulmonary disease. Electronically Signed   By: Lupita RaiderJames  Green Jr M.D.   On: 05/24/2019 16:23   Ct Head Wo Contrast  Result Date: 05/24/2019 CLINICAL DATA:  Right arm and leg heaviness x 5 days EXAM: CT HEAD WITHOUT CONTRAST TECHNIQUE: Contiguous axial images were obtained from the base of the skull through the vertex without intravenous contrast. COMPARISON:  None. FINDINGS: Brain: No evidence of acute infarction, hemorrhage, hydrocephalus, extra-axial collection or mass lesion/mass effect. Periventricular white matter low attenuation as can be seen with microvascular disease. Vascular: No hyperdense vessel or unexpected calcification. Skull: No osseous abnormality. Sinuses/Orbits: Visualized paranasal sinuses are clear. Visualized mastoid sinuses are clear. Visualized orbits demonstrate no focal abnormality. Other: None IMPRESSION: No acute intracranial pathology. Electronically Signed   By: Elige KoHetal  Patel   On: 05/24/2019 16:52    ____________________________________________   PROCEDURES  Procedure(s) performed (including Critical Care):  Procedures   ____________________________________________   INITIAL IMPRESSION / ASSESSMENT AND PLAN / ED COURSE  As part of my medical decision making, I reviewed the following data within the electronic MEDICAL RECORD NUMBER Evaluated by EM attending  Dr. Roxan Hockeyobinson.      73 year old female presenting to the emergency department several days after experiencing heaviness and weakness in her right upper and lower extremity.  That has since resolved, but states that she has just felt tired and not like herself since.  Also, she is noted to be hypertensive and ECG shows a left bundle branch block with sinus tachycardia.  There are no previous EKGs to review.  She states that she has not seen her primary care provider in a few years.  She has been told in the past that she is hypertensive, but has never been given any antihypertensive  medications.  ----------------------------------------- 3:52 PM on 05/24/2019 -----------------------------------------  Plan will be to admit the patient for stroke/TIA work-up.  5 mg of labetalol was given IV with slight decrease in blood pressure and decrease in heart rate.  Patient states that her headache has lessened.  She does complain of anxiety and Ativan has been ordered.  Plan for admission was discussed with the patient.  She is agreeable.  Awaiting CT results and chest x-ray results, then will discuss with hospitalist.   ----------------------------------------- 4:42 PM on 05/24/2019 -----------------------------------------  CT of the head is normal. Patient to be admitted. Care relinquished to Dr. Juliene PinaMody.     ____________________________________________   FINAL CLINICAL IMPRESSION(S) / ED DIAGNOSES  Final diagnoses:  Hypertensive urgency  TIA (transient ischemic attack)  New onset left bundle branch block (LBBB)     ED Discharge Orders    None       Note:  This document was prepared using Dragon voice recognition software and may include unintentional dictation errors.    Chinita Pesterriplett, Elley Harp B, FNP 05/24/19 2034    Willy Eddyobinson, Patrick, MD 05/24/19 [phone]    Willy Eddyobinson, Patrick, MD 05/24/19 [phone]

## 2019-05-24 NOTE — ED Notes (Signed)
covid swab obtained and sent

## 2019-05-24 NOTE — ED Triage Notes (Signed)
Pt comes via POV from home with c/o hypertension and weakness. Pt states that this past Friday night she had an episode of weakness to right leg and right arm. Pt states it then subsided.  Pt states that Saturday she felt weak and has since just not felt right. Pt states she was told by PCP to come to walk in and then was sent here from walk-in clinic.  Pt states she has just been feeling so weak.  Pt is alert and oriented. Mini neuro negative. No slurred speech, headache.

## 2019-05-24 NOTE — ED Notes (Signed)
Awaiting md eval

## 2019-05-24 NOTE — ED Notes (Signed)
MRI screen completed.

## 2019-05-25 ENCOUNTER — Inpatient Hospital Stay: Payer: Medicare Other

## 2019-05-25 ENCOUNTER — Encounter: Payer: Self-pay | Admitting: Internal Medicine

## 2019-05-25 ENCOUNTER — Inpatient Hospital Stay (HOSPITAL_COMMUNITY)
Admit: 2019-05-25 | Discharge: 2019-05-25 | Disposition: A | Discharge status: Home / Self Care | Payer: Medicare Other | Attending: Internal Medicine | Admitting: Internal Medicine

## 2019-05-25 DIAGNOSIS — I639 Cerebral infarction, unspecified: Secondary | ICD-10-CM

## 2019-05-25 DIAGNOSIS — I160 Hypertensive urgency: Secondary | ICD-10-CM

## 2019-05-25 DIAGNOSIS — R9431 Abnormal electrocardiogram [ECG] [EKG]: Secondary | ICD-10-CM

## 2019-05-25 LAB — URINE CULTURE

## 2019-05-25 LAB — BASIC METABOLIC PANEL
Anion gap: 9 (ref 5–15)
BUN: 25 mg/dL — ABNORMAL HIGH (ref 8–23)
CO2: 24 mmol/L (ref 22–32)
Calcium: 8.6 mg/dL — ABNORMAL LOW (ref 8.9–10.3)
Chloride: 107 mmol/L (ref 98–111)
Creatinine, Ser: 1 mg/dL (ref 0.44–1.00)
GFR calc Af Amer: >60 mL/min (ref >60)
GFR calc non Af Amer: 57 mL/min — ABNORMAL LOW (ref >60)
Glucose, Bld: 107 mg/dL — ABNORMAL HIGH (ref 70–99)
Potassium: 3.9 mmol/L (ref 3.5–5.1)
Sodium: 140 mmol/L (ref 135–145)

## 2019-05-25 LAB — ECHOCARDIOGRAM COMPLETE
Height: 61 in
Weight: 2377.6 oz

## 2019-05-25 LAB — NOVEL CORONAVIRUS, NAA (HOSP ORDER, SEND-OUT TO REF LAB; TAT 18-24 HRS): SARS-CoV-2, NAA: NOT DETECTED

## 2019-05-25 MED ORDER — AMLODIPINE BESYLATE 5 MG PO TABS
5.0000 mg | ORAL_TABLET | Freq: Every day | ORAL | Status: DC
  Administered 2019-05-25: 11:00:00 5 mg via ORAL
  Filled 2019-05-25: qty 1

## 2019-05-25 MED ORDER — ATORVASTATIN CALCIUM 20 MG PO TABS
40.0000 mg | ORAL_TABLET | Freq: Every day | ORAL | Status: DC
  Administered 2019-05-25 – 2019-05-27 (×3): 40 mg via ORAL
  Filled 2019-05-25 (×3): qty 2

## 2019-05-25 MED ORDER — CLOPIDOGREL BISULFATE 75 MG PO TABS
75.0000 mg | ORAL_TABLET | Freq: Every day | ORAL | Status: DC
  Administered 2019-05-25 – 2019-05-27 (×3): 75 mg via ORAL
  Filled 2019-05-25 (×3): qty 1

## 2019-05-25 NOTE — TOC Initial Note (Signed)
Transition of Care [facility]) - Initial/Assessment Note    Patient Details  Name: Luvina Poirier MRN: [id number] Date of Birth: [birth year]  [facility] CM/SW Contact:    Katrina Stack, RN Phone Number: 05/25/2019, 6:43 PM  Clinical Narrative:                Patient doe have PCP at [facility] Presented with hypotension and ruled in for CVA. Per primary nurse, there are no deficits.  Physical therapy has recommended outpatient therapy.   Expected Discharge Plan: OP Rehab Barriers to Discharge: No Barriers Identified   Patient Goals and CMS Choice        Expected Discharge Plan and Services Expected Discharge Plan: OP Rehab   Discharge Planning Services: CM Consult   Living arrangements for the past 2 months: Single Family Home                                      Prior Living Arrangements/Services Living arrangements for the past 2 months: Single Family Home Lives with:: Spouse Patient language and need for interpreter reviewed:: Yes Do you feel safe going back to the place where you live?: Yes      Need for Family Participation in Patient Care: No (Comment) Care giver support system in place?: Yes (comment)      Activities of Daily Living Home Assistive Devices/Equipment: None ADL Screening (condition at time of admission) Patient's cognitive ability adequate to safely complete daily activities?: Yes Is the patient deaf or have difficulty hearing?: No Does the patient have difficulty seeing, even when wearing glasses/contacts?: No Does the patient have difficulty concentrating, remembering, or making decisions?: No Patient able to express need for assistance with ADLs?: Yes Does the patient have difficulty dressing or bathing?: No Independently performs ADLs?: Yes (appropriate for developmental age) Does the patient have difficulty walking or climbing stairs?: No Weakness of Legs: None Weakness of Arms/Hands: None  Permission  Sought/Granted                  Emotional Assessment Appearance:: Appears stated age   Affect (typically observed): Calm Orientation: : Oriented to Self, Oriented to Place, Oriented to  Time, Oriented to Situation Alcohol / Substance Use: Not Applicable Psych Involvement: No (comment)  Admission diagnosis:  TIA (transient ischemic attack) [G45.9] Cerebral infarction ([place]) [I63.9] CVA (cerebral infarction) [I63.9] Hypertensive urgency [I16.0] New onset left bundle branch block (LBBB) [I44.7] Patient Active Problem List   Diagnosis Date Noted  . HTN (hypertension) 05/24/2019   PCP:  Patient, No Pcp Per Pharmacy:   [place] [place] [address] Phone: [phone] Fax: [phone]     Social Determinants of Health (SDOH) Interventions    Readmission Risk Interventions No flowsheet data found.

## 2019-05-25 NOTE — Progress Notes (Signed)
Patient's husband called and wanted an update from MD.  Dr. Darvin Neighbours aware

## 2019-05-25 NOTE — Progress Notes (Signed)
Advanced Directive education requested. AD education offered. Patient acknowledged some memory struggles. Chaplain provide follow-up on July 1 to offer additional education. Husband is primary support and patient believes she has a living will.    05/25/19 0700  Clinical Encounter Type  Visited With Patient  Visit Type Initial  Referral From Nurse  Consult/Referral To Chaplain  Spiritual Encounters  Spiritual Needs Other (Comment)  Stress Factors  Patient Stress Factors Health changes

## 2019-05-25 NOTE — Progress Notes (Signed)
[employer] - [place] [facility]   PATIENT NAME: Roberta Nunez    MR#:  [id number]  DATE OF BIRTH:  17-Feb-1946  SUBJECTIVE:  CHIEF COMPLAINT:   Chief Complaint  Patient presents with  . Weakness  . Hypertension   Feels generally weak since receiving ativan  REVIEW OF SYSTEMS:    Review of Systems  Constitutional: Positive for malaise/fatigue. Negative for chills and fever.  HENT: Negative for sore throat.   Eyes: Negative for blurred vision, double vision and pain.  Respiratory: Negative for cough, hemoptysis, shortness of breath and wheezing.   Cardiovascular: Negative for chest pain, palpitations, orthopnea and leg swelling.  Gastrointestinal: Negative for abdominal pain, constipation, diarrhea, heartburn, nausea and vomiting.  Genitourinary: Negative for dysuria and hematuria.  Musculoskeletal: Negative for back pain and joint pain.  Skin: Negative for rash.  Neurological: Negative for sensory change, speech change, focal weakness and headaches.  Endo/Heme/Allergies: Does not bruise/bleed easily.  Psychiatric/Behavioral: Negative for depression. The patient is not nervous/anxious.     DRUG ALLERGIES:  Not on File  VITALS:  Blood pressure (!) 181/65, pulse 74, temperature 98 F (36.7 C), temperature source Oral, resp. rate 19, height 5\' 1"  (1.549 m), weight 67.4 kg, SpO2 99 %.  PHYSICAL EXAMINATION:   Physical Exam  GENERAL:  73 y.o.-year-old patient lying in the bed with no acute distress.  EYES: Pupils equal, round, reactive to light and accommodation. No scleral icterus. Extraocular muscles intact.  HEENT: Head atraumatic, normocephalic. Oropharynx and nasopharynx clear.  NECK:  Supple, no jugular venous distention. No thyroid enlargement, no tenderness.  LUNGS: Normal breath sounds bilaterally, no wheezing, rales, rhonchi. No use of accessory muscles of respiration.  CARDIOVASCULAR: S1, S2 normal. No murmurs, rubs, or gallops.  ABDOMEN: Soft,  nontender, nondistended. Bowel sounds present. No organomegaly or mass.  EXTREMITIES: No cyanosis, clubbing or edema b/l.    NEUROLOGIC: Cranial nerves II through XII are intact. No focal Motor or sensory deficits b/l.   PSYCHIATRIC: The patient is alert and oriented x 3.  SKIN: No obvious rash, lesion, or ulcer.   LABORATORY PANEL:   CBC Recent Labs  Lab 05/24/19 1501  WBC 12.3*  HGB 15.0  HCT 47.3*  PLT 170   ------------------------------------------------------------------------------------------------------------------ Chemistries  Recent Labs  Lab 05/24/19 1501 05/25/19 0349  NA 139 140  K 3.9 3.9  CL 104 107  CO2 22 24  GLUCOSE 116* 107*  BUN 23 25*  CREATININE 0.84 1.00  CALCIUM 9.1 8.6*  AST 18  --   ALT 12  --   ALKPHOS 81  --   BILITOT 0.8  --    ------------------------------------------------------------------------------------------------------------------  Cardiac Enzymes No results for input(s): TROPONINI in the last 168 hours. ------------------------------------------------------------------------------------------------------------------  RADIOLOGY:  Dg Chest 2 View  Result Date: 05/24/2019 CLINICAL DATA:  Weakness. EXAM: CHEST - 2 VIEW COMPARISON:  None. FINDINGS: The heart size and mediastinal contours are within normal limits. Both lungs are clear. The visualized skeletal structures are unremarkable. IMPRESSION: No active cardiopulmonary disease. Electronically Signed   By: Lupita RaiderJames  Green Jr M.D.   On: 05/24/2019 16:23   Ct Head Wo Contrast  Result Date: 05/24/2019 CLINICAL DATA:  Right arm and leg heaviness x 5 days EXAM: CT HEAD WITHOUT CONTRAST TECHNIQUE: Contiguous axial images were obtained from the base of the skull through the vertex without intravenous contrast. COMPARISON:  None. FINDINGS: Brain: No evidence of acute infarction, hemorrhage, hydrocephalus, extra-axial collection or mass lesion/mass effect. Periventricular white matter low  attenuation as can be seen with microvascular disease. Vascular: No hyperdense vessel or unexpected calcification. Skull: No osseous abnormality. Sinuses/Orbits: Visualized paranasal sinuses are clear. Visualized mastoid sinuses are clear. Visualized orbits demonstrate no focal abnormality. Other: None IMPRESSION: No acute intracranial pathology. Electronically Signed   By: Kathreen Devoid   On: 05/24/2019 16:52   Mr Brain Wo Contrast  Result Date: 05/24/2019 CLINICAL DATA:  Follow-up examination for acute stroke. Right-sided weakness. Is EXAM: MRI HEAD WITHOUT CONTRAST TECHNIQUE: Multiplanar, multiecho pulse sequences of the brain and surrounding structures were obtained without intravenous contrast. COMPARISON:  Prior CT from earlier the same day. FINDINGS: Brain: Cerebral volume within normal limits for age. Patchy T2/FLAIR hyperintensity within the periventricular and deep white matter both cerebral hemispheres most consistent with chronic small vessel ischemic disease, mild to moderate nature. 14 mm focus of restricted diffusion within the posterior left lentiform nucleus consistent with acute ischemic lacunar type infarct (series 12, image 23). No associated hemorrhage or mass effect. No other evidence for acute or subacute ischemia. Gray-white matter differentiation otherwise maintained. No encephalomalacia to suggest chronic cortical infarction. No foci of susceptibility artifact to suggest acute or chronic intracranial hemorrhage. No mass lesion, midline shift or mass effect. No hydrocephalus. No extra-axial fluid collection. Pituitary gland suprasellar region normal. Vascular: Major intracranial vascular flow voids are maintained. Skull and upper cervical spine: Craniocervical junction within normal limits. Bone marrow signal intensity normal. No acute scalp soft tissue abnormality. 14 mm lesion protruding from the left temporal scalp noted, indeterminate, but most likely benign. Sinuses/Orbits: Globes  normal soft tissues within normal limits. Paranasal sinuses are clear. No significant mastoid effusion. Inner ear structures normal. Other: None. IMPRESSION: 1. 14 mm acute ischemic nonhemorrhagic lacunar type infarct involving the posterior left lentiform nucleus. 2. 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 Electronically Signed   By: Jeannine Boga M.D.   On: 05/24/2019 23:19     ASSESSMENT AND PLAN:   73 year old female with no past medical history who presents with generalized weakness.  *Acute CVA of left lentiform nucleus Aspirin, Plavix, statin Carotid Dopplers and echocardiogram-with no significant findings  *Accelerated hypertension.  Started on Norvasc.  Monitor blood pressure.  *DVT prophylaxis with Lovenox  All the records are reviewed and case discussed with Care Management/Social Workerr. Management plans discussed with the patient, family and they are in agreement.  CODE STATUS: FULL CODE  Called and updated husband over the phone  TOTAL TIME TAKING CARE OF THIS PATIENT: 35 minutes.   POSSIBLE D/C IN 1-2 DAYS, DEPENDING ON CLINICAL CONDITION.  Leia Alf Jonluke Cobbins M.D on 05/25/2019 at 3:51 PM  Between 7am to 6pm - Pager - [phone]  After 6pm go to [email or web address] - password [email or web address] [facility]  Office  [phone]  CC: Primary care physician; Patient, No Pcp Per  Note: This dictation was prepared with Dragon dictation along with smaller phrase technology. Any transcriptional errors that result from this process are unintentional.

## 2019-05-25 NOTE — Progress Notes (Signed)
*  PRELIMINARY RESULTS* Echocardiogram 2D Echocardiogram has been performed.  Roberta Nunez 05/25/2019, 10:33 AM

## 2019-05-25 NOTE — Consult Note (Signed)
[facility]  CARDIOLOGY CONSULT NOTE  Patient ID: Roberta Nunez MRN: [id number] DOB/AGE: 08-01-1946 73 y.o.  Admit date: 05/24/2019 Referring Physician Mody Primary Physician None per patient Primary Cardiologist None per patient Reason for Consultation new LBBB  HPI: 73 year old female referred for evaluation of new left bundle branch block. The patient reportedly has an insignificant past medical history. The patient reportedly has a history of elevated blood pressure in the past, but has not been treated. The patient presented [facility] yesterday for a several week history of occasional vertigo. She also reported a brief episode of right upper and lower extremity weakness that occured days before, which subsided after about 5 minutes. Her blood pressure was noted to be very elevated at 230/142. She was sent to the ER for evaluation of hypertensive urgency and possible TIA. ECG revealed sinus tachycardia at a rate of 115 bpm with biatrial enlargement with LBBB, with no prior ECGs for comparison. Chest xray negative for acute abnormality. Admission labs notable for normal range hs-troponin 15, 10, and 15, and WBC 12.3. 2D echocardiogram during this admission reveals LVEF 60-65% with mild LVH. Brain MRI revealed acute left lentiform nucleus infarct, likely due to small vessel disease related to hypertension, per Dr. Thad Rangereynolds. The patient was started on aspirin, Plavix, atorvastatin, metoprolol tartrate, and amlodipine.  Currently, the patient reports feeling better. She denies a known prior cardiac history, denies chest pain, shortness of breath, palpitations, or peripheral edema.  Review of systems complete and found to be negative unless listed above     History reviewed. No pertinent past medical history.  History reviewed. No pertinent surgical history.  Medications Prior to Admission  Medication Sig Dispense Refill Last Dose  . acetaminophen (TYLENOL) 325 MG tablet Take 650  mg by mouth every 6 (six) hours as needed.   Past Week at Unknown time   Social History   Socioeconomic History  . Marital status: Married    Spouse name: Not on file  . Number of children: Not on file  . Years of education: Not on file  . Highest education level: Not on file  Occupational History  . Not on file  Social Needs  . Financial resource strain: Not on file  . Food insecurity    Worry: Not on file    Inability: Not on file  . Transportation needs    Medical: Not on file    Non-medical: Not on file  Tobacco Use  . Smoking status: Never Smoker  . Smokeless tobacco: Never Used  Substance and Sexual Activity  . Alcohol use: Not on file  . Drug use: Not on file  . Sexual activity: Not on file  Lifestyle  . Physical activity    Days per week: Not on file    Minutes per session: Not on file  . Stress: Not on file  Relationships  . Social Musicianconnections    Talks on phone: Not on file    Gets together: Not on file    Attends religious service: Not on file    Active member of club or organization: Not on file    Attends meetings of clubs or organizations: Not on file    Relationship status: Not on file  . Intimate partner violence    Fear of current or ex partner: Not on file    Emotionally abused: Not on file    Physically abused: Not on file    Forced sexual activity: Not on file  Other  Topics Concern  . Not on file  Social History Narrative  . Not on file    No family history on file.    Review of systems complete and found to be negative unless listed above      PHYSICAL EXAM  General: Well developed, well nourished, in no acute distress, sitting up in recliner finishing lunch HEENT:  Normocephalic and atramatic Neck:  No JVD.  Lungs: Clear bilaterally to auscultation, normal effort of breathing Heart: HRRR . Normal S1 and S2 without gallops or murmurs.  Abdomen: nondistended Msk:  Back normal, gait not assessed. No obvious deformity. Extremities:  No clubbing, cyanosis or edema.   Neuro: Alert and oriented X 3. Psych:  Good affect, responds appropriately  Labs:   Lab Results  Component Value Date   WBC 12.3 (H) 05/24/2019   HGB 15.0 05/24/2019   HCT 47.3 (H) 05/24/2019   MCV 93.3 05/24/2019   PLT 170 05/24/2019    Recent Labs  Lab 05/24/19 1501 05/25/19 0349  NA 139 140  K 3.9 3.9  CL 104 107  CO2 22 24  BUN 23 25*  CREATININE 0.84 1.00  CALCIUM 9.1 8.6*  PROT 8.1  --   BILITOT 0.8  --   ALKPHOS 81  --   ALT 12  --   AST 18  --   GLUCOSE 116* 107*   No results found for: CKTOTAL, CKMB, CKMBINDEX, TROPONINI  Lab Results  Component Value Date   CHOL 176 05/24/2019   Lab Results  Component Value Date   HDL 48 05/24/2019   Lab Results  Component Value Date   LDLCALC 110 (H) 05/24/2019   Lab Results  Component Value Date   TRIG 92 05/24/2019   Lab Results  Component Value Date   CHOLHDL 3.7 05/24/2019   No results found for: LDLDIRECT    Radiology: Dg Chest 2 View  Result Date: 05/24/2019 CLINICAL DATA:  Weakness. EXAM: CHEST - 2 VIEW COMPARISON:  None. FINDINGS: The heart size and mediastinal contours are within normal limits. Both lungs are clear. The visualized skeletal structures are unremarkable. IMPRESSION: No active cardiopulmonary disease. Electronically Signed   By: Marijo Conception M.D.   On: 05/24/2019 16:23   Ct Head Wo Contrast  Result Date: 05/24/2019 CLINICAL DATA:  Right arm and leg heaviness x 5 days EXAM: CT HEAD WITHOUT CONTRAST TECHNIQUE: Contiguous axial images were obtained from the base of the skull through the vertex without intravenous contrast. COMPARISON:  None. FINDINGS: Brain: No evidence of acute infarction, hemorrhage, hydrocephalus, extra-axial collection or mass lesion/mass effect. Periventricular white matter low attenuation as can be seen with microvascular disease. Vascular: No hyperdense vessel or unexpected calcification. Skull: No osseous abnormality. Sinuses/Orbits:  Visualized paranasal sinuses are clear. Visualized mastoid sinuses are clear. Visualized orbits demonstrate no focal abnormality. Other: None IMPRESSION: No acute intracranial pathology. Electronically Signed   By: Kathreen Devoid   On: 05/24/2019 16:52   Mr Brain Wo Contrast  Result Date: 05/24/2019 CLINICAL DATA:  Follow-up examination for acute stroke. Right-sided weakness. Is EXAM: MRI HEAD WITHOUT CONTRAST TECHNIQUE: Multiplanar, multiecho pulse sequences of the brain and surrounding structures were obtained without intravenous contrast. COMPARISON:  Prior CT from earlier the same day. FINDINGS: Brain: Cerebral volume within normal limits for age. Patchy T2/FLAIR hyperintensity within the periventricular and deep white matter both cerebral hemispheres most consistent with chronic small vessel ischemic disease, mild to moderate nature. 14 mm focus of restricted diffusion within the posterior  left lentiform nucleus consistent with acute ischemic lacunar type infarct (series 12, image 23). No associated hemorrhage or mass effect. No other evidence for acute or subacute ischemia. Gray-white matter differentiation otherwise maintained. No encephalomalacia to suggest chronic cortical infarction. No foci of susceptibility artifact to suggest acute or chronic intracranial hemorrhage. No mass lesion, midline shift or mass effect. No hydrocephalus. No extra-axial fluid collection. Pituitary gland suprasellar region normal. Vascular: Major intracranial vascular flow voids are maintained. Skull and upper cervical spine: Craniocervical junction within normal limits. Bone marrow signal intensity normal. No acute scalp soft tissue abnormality. 14 mm lesion protruding from the left temporal scalp noted, indeterminate, but most likely benign. Sinuses/Orbits: Globes normal soft tissues within normal limits. Paranasal sinuses are clear. No significant mastoid effusion. Inner ear structures normal. Other: None. IMPRESSION: 1. 14  mm acute ischemic nonhemorrhagic lacunar type infarct involving the posterior left lentiform nucleus. 2. 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 Electronically Signed   By: Rise MuBenjamin  McClintock M.D.   On: 05/24/2019 23:19    EKG: sinus rhythm, LBBB  ASSESSMENT AND PLAN:  1. Apparent new LBBB without prior ECGs for comparison. Hs-troponin within normal range x 3, patient without chest pain. 2D echocardiogram reveals normal left ventricular function without obvious wall motion abnormality. 2. Hypertensive urgency, started on metoprolol and amlodipine. Patient experienced stroke like symptoms in setting of elevated blood pressure. 3. 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 Thad Rangereynolds. Started on aspirin, Plavix, and statin  Recommendations: 1. Blood pressure control; continue metoprolol, amlodipine, 2.  Continue aspirin, Plavix, atorvastatin 3.  No further cardiac diagnostics recommended at this time. 4.  Recommend follow-up as outpatient with Dr. Darrold JunkerParaschos in approximately 2 weeks  Signed: Leanora Ivanoffnna Salif Tay PA-C 05/25/2019, 1:59 PM  Discussed with Dr. Darrold JunkerParaschos who agrees with the plan.

## 2019-05-25 NOTE — Consult Note (Signed)
Referring Physician: Sudini    Chief Complaint: Right sided weakness  HPI: Roberta Nunez is an 73 y.o. female who reports that she was at baseline on 6/26 she was at home and noted acute onset inability to control her right hand.  "It felt heavy".  She then noted that her right leg was heavy and she was unable to stand and walk.  Symptoms only lasted a few minutes and resolved spontaneously.  Patient has not felt well since then with an over all feeling of weakness.  Came in for evaluation.  Initial NIHSS of 0   Date last known well: Date: 05/20/2019 Time last known well: Time: 19:30 tPA Given: No: Outside time window, resolution of symptoms  History reviewed. No pertinent past medical history.  History reviewed. No pertinent surgical history.  Family history: Father with HTN, brother with DM  Social History:  reports that she has never smoked. She has never used smokeless tobacco. No history on file for alcohol and drug.  Allergies: Not on File  Medications:  I have reviewed the patient's current medications. Prior to Admission:  Medications Prior to Admission  Medication Sig Dispense Refill Last Dose  . acetaminophen (TYLENOL) 325 MG tablet Take 650 mg by mouth every 6 (six) hours as needed.   Past Week at Unknown time   Scheduled: . amLODipine  5 mg Oral Daily  . aspirin EC  81 mg Oral Daily  . atorvastatin  40 mg Oral q1800  . enoxaparin (LOVENOX) injection  40 mg Subcutaneous Q24H  . metoprolol tartrate  25 mg Oral BID  . sodium chloride flush  3 mL Intravenous Q12H    ROS: History obtained from the patient  General ROS: fatigue Psychological ROS: negative for - behavioral disorder, hallucinations, memory difficulties, mood swings or suicidal ideation Ophthalmic ROS: negative for - blurry vision, double vision, eye pain or loss of vision ENT ROS: negative for - epistaxis, nasal discharge, oral lesions, sore throat, tinnitus or vertigo Allergy and Immunology ROS:  negative for - hives or itchy/watery eyes Hematological and Lymphatic ROS: negative for - bleeding problems, bruising or swollen lymph nodes Endocrine ROS: negative for - galactorrhea, hair pattern changes, polydipsia/polyuria or temperature intolerance Respiratory ROS: negative for - cough, hemoptysis, shortness of breath or wheezing Cardiovascular ROS: negative for - chest pain, dyspnea on exertion, edema or irregular heartbeat Gastrointestinal ROS: negative for - abdominal pain, diarrhea, hematemesis, nausea/vomiting or stool incontinence Genito-Urinary ROS: negative for - dysuria, hematuria, incontinence or urinary frequency/urgency Musculoskeletal ROS: generalized weakness Neurological ROS: as noted in HPI Dermatological ROS: negative for rash and skin lesion changes  Physical Examination: Blood pressure (!) 181/70, pulse 74, temperature 98 F (36.7 C), temperature source Oral, resp. rate 19, height 5\' 1"  (1.549 m), weight 67.4 kg, SpO2 99 %.  HEENT-  Normocephalic, no lesions, without obvious abnormality.  Normal external eye and conjunctiva.  Normal TM's bilaterally.  Normal auditory canals and external ears. Normal external nose, mucus membranes and septum.  Normal pharynx. Cardiovascular- S1, S2 normal, pulses palpable throughout   Lungs- chest clear, no wheezing, rales, normal symmetric air entry Abdomen- soft, non-tender; bowel sounds normal; no masses,  no organomegaly Extremities- no edema Lymph-no adenopathy palpable Musculoskeletal-no joint tenderness, deformity or swelling Skin-warm and dry, no hyperpigmentation, vitiligo, or suspicious lesions  Neurological Examination   Mental Status: Alert, oriented, thought content appropriate.  Speech fluent without evidence of aphasia.  Able to follow 3 step commands without difficulty. Cranial Nerves: II: Discs flat  bilaterally; Visual fields grossly normal, pupils equal, round, reactive to light and accommodation III,IV, VI:  ptosis not present, extra-ocular motions intact bilaterally V,VII: smile symmetric, facial light touch sensation normal bilaterally VIII: hearing normal bilaterally IX,X: gag reflex present XI: bilateral shoulder shrug XII: midline tongue extension Motor: Right : Upper extremity   5/5    Left:     Upper extremity   5/5  Lower extremity   5-/5     Lower extremity   5/5 Tone and bulk:normal tone throughout; no atrophy noted Sensory: Pinprick and light touch intact throughout, bilaterally Deep Tendon Reflexes: 2+ and symmetric with absent AJ's bilaterally Plantars: Right: downgoing   Left: downgoing Cerebellar: Normal finger-to-nose, normal rapid alternating movements and normal heel-to-shin testing bilaterally Gait: not tested due to safety concerns    Laboratory Studies:  Basic Metabolic Panel: Recent Labs  Lab 05/24/19 1500 05/24/19 1501 05/25/19 0349  NA 139 139 140  K 3.9 3.9 3.9  CL 106 104 107  CO2 21* 22 24  GLUCOSE 112* 116* 107*  BUN 20 23 25*  CREATININE 0.81 0.84 1.00  CALCIUM 8.7* 9.1 8.6*    Liver Function Tests: Recent Labs  Lab 05/24/19 1500 05/24/19 1501  AST 15 18  ALT 12 12  ALKPHOS 73 81  BILITOT 0.7 0.8  PROT 7.1 8.1  ALBUMIN 3.6 4.2   No results for input(s): LIPASE, AMYLASE in the last 168 hours. No results for input(s): AMMONIA in the last 168 hours.  CBC: Recent Labs  Lab 05/24/19 1501  WBC 12.3*  NEUTROABS 9.6*  HGB 15.0  HCT 47.3*  MCV 93.3  PLT 170    Cardiac Enzymes: No results for input(s): CKTOTAL, CKMB, CKMBINDEX, TROPONINI in the last 168 hours.  BNP: Invalid input(s): POCBNP  CBG: No results for input(s): GLUCAP in the last 168 hours.  Microbiology: No results found for this or any previous visit.  Coagulation Studies: Recent Labs    05/24/19 1501  LABPROT 12.3  INR 0.9    Urinalysis:  Recent Labs  Lab 05/24/19 1501  COLORURINE YELLOW*  LABSPEC 1.017  PHURINE 5.0  GLUCOSEU NEGATIVE  HGBUR SMALL*   BILIRUBINUR NEGATIVE  KETONESUR NEGATIVE  PROTEINUR NEGATIVE  NITRITE NEGATIVE  LEUKOCYTESUR MODERATE*    Lipid Panel:    Component Value Date/Time   CHOL 176 05/24/2019 1501   TRIG 92 05/24/2019 1501   HDL 48 05/24/2019 1501   CHOLHDL 3.7 05/24/2019 1501   VLDL 18 05/24/2019 1501   LDLCALC 110 (H) 05/24/2019 1501    HgbA1C: No results found for: HGBA1C  Urine Drug Screen:  No results found for: LABOPIA, COCAINSCRNUR, LABBENZ, AMPHETMU, THCU, LABBARB  Alcohol Level: No results for input(s): ETH in the last 168 hours.  Other results: EKG: sinus tachycardia at 115 bpm.  Imaging: Dg Chest 2 View  Result Date: 05/24/2019 CLINICAL DATA:  Weakness. EXAM: CHEST - 2 VIEW COMPARISON:  None. FINDINGS: The heart size and mediastinal contours are within normal limits. Both lungs are clear. The visualized skeletal structures are unremarkable. IMPRESSION: No active cardiopulmonary disease. Electronically Signed   By: Lupita RaiderJames  Green Jr M.D.   On: 05/24/2019 16:23   Ct Head Wo Contrast  Result Date: 05/24/2019 CLINICAL DATA:  Right arm and leg heaviness x 5 days EXAM: CT HEAD WITHOUT CONTRAST TECHNIQUE: Contiguous axial images were obtained from the base of the skull through the vertex without intravenous contrast. COMPARISON:  None. FINDINGS: Brain: No evidence of acute infarction, hemorrhage, hydrocephalus, extra-axial  collection or mass lesion/mass effect. Periventricular white matter low attenuation as can be seen with microvascular disease. Vascular: No hyperdense vessel or unexpected calcification. Skull: No osseous abnormality. Sinuses/Orbits: Visualized paranasal sinuses are clear. Visualized mastoid sinuses are clear. Visualized orbits demonstrate no focal abnormality. Other: None IMPRESSION: No acute intracranial pathology. Electronically Signed   By: Kathreen Devoid   On: 05/24/2019 16:52   Mr Brain Wo Contrast  Result Date: 05/24/2019 CLINICAL DATA:  Follow-up examination for acute  stroke. Right-sided weakness. Is EXAM: MRI HEAD WITHOUT CONTRAST TECHNIQUE: Multiplanar, multiecho pulse sequences of the brain and surrounding structures were obtained without intravenous contrast. COMPARISON:  Prior CT from earlier the same day. FINDINGS: Brain: Cerebral volume within normal limits for age. Patchy T2/FLAIR hyperintensity within the periventricular and deep white matter both cerebral hemispheres most consistent with chronic small vessel ischemic disease, mild to moderate nature. 14 mm focus of restricted diffusion within the posterior left lentiform nucleus consistent with acute ischemic lacunar type infarct (series 12, image 23). No associated hemorrhage or mass effect. No other evidence for acute or subacute ischemia. Gray-white matter differentiation otherwise maintained. No encephalomalacia to suggest chronic cortical infarction. No foci of susceptibility artifact to suggest acute or chronic intracranial hemorrhage. No mass lesion, midline shift or mass effect. No hydrocephalus. No extra-axial fluid collection. Pituitary gland suprasellar region normal. Vascular: Major intracranial vascular flow voids are maintained. Skull and upper cervical spine: Craniocervical junction within normal limits. Bone marrow signal intensity normal. No acute scalp soft tissue abnormality. 14 mm lesion protruding from the left temporal scalp noted, indeterminate, but most likely benign. Sinuses/Orbits: Globes normal soft tissues within normal limits. Paranasal sinuses are clear. No significant mastoid effusion. Inner ear structures normal. Other: None. IMPRESSION: 1. 14 mm acute ischemic nonhemorrhagic lacunar type infarct involving the posterior left lentiform nucleus. 2. 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 Electronically Signed   By: Jeannine Boga M.D.   On: 05/24/2019 23:19    Assessment: 73 y.o. female presenting after an episode of right sided weakness that occurred on  6/26.  Patient hypertensive at the time of presentation.  Reports she has been hypertensive in the past but never treated.  MRI of the brain reviewed and shows an acute left lentiform nucleus infarct.  Etiology likely small vessel disease related to HTN.  Patient on no antiplatelet therapy prior to admission.  Echocardiogram and carotid dopplers are pending.  LDL 110.    Stroke Risk Factors - hypertension  Plan: 1. HgbA1c 2. PT consult, OT consult, Speech consult 3. Echocardiogram pending 4. Carotid dopplers pending 5. Prophylactic therapy-Dual antiplatelet therapy with ASA 81mg  and Plavix 75mg  for three weeks with change to ASA 81mg  daily alone as monotherapy after that time. 6. Statin for lipid management with target LDL<70. 7. NPO until RN stroke swallow screen 8. Telemetry monitoring 9. Frequent neuro checks 10. BP control   Alexis Goodell, MD Neurology [phone] 05/25/2019, 1:11 PM

## 2019-05-25 NOTE — Evaluation (Signed)
Physical Therapy Evaluation Patient Details Name: Roberta Nunez MRN: [id number] DOB: 1946/03/16 Today's Date: 05/25/2019   History of Present Illness  presented to ER secondary to elevated BP, generalized weakness, intermittent periods of R UE > LE weakness; admitted for management of accelerated HTN and TIA/CVA work up.  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  Clinical Impression  Upon evaluation, patient alert and oriented; follows commands and demonstrates good effort with mobility tasks, good insight into safety needs. Mild coordination deficit in R UE; otherwise, strength and ROM grossly WFL without focal weakness or sensory deficit. Mild higher-level balance deficits noted, requiring cga/min assist and use of LE step strategy for recovery at times. Modified DGI 8/12, indicative of fall risk; may benefit from use of assist device for longer, community (busier, more distracting) environments.  Able to complete bed mobility with indep; sit/stand, basic transfers and gait (400') without assist device, cga/close sup.  Excessive sway to R with head turns and changes of direction, but self-corrects with increase time (and occasional cga from therapist).  Mechanics improve with cuing for increased, more normalized cadence during gait efforts.   Would benefit from skilled PT to address above deficits and promote optimal return to PLOF; recommend transition to home with outpatient PT follow up to address remaining balance deficits as needed.    Follow Up Recommendations Outpatient PT    Equipment Recommendations  (has access to RW if needed)    Recommendations for Other Services       Precautions / Restrictions Precautions Precautions: Fall Restrictions Weight Bearing Restrictions: No      Mobility  Bed Mobility Overal bed mobility: Modified Independent                Transfers Overall transfer level: Needs assistance   Transfers: Sit to/from Stand Sit to Stand: Min  guard;Supervision            Ambulation/Gait Ambulation/Gait assistance: Min guard;Supervision Gait Distance (Feet): 400 Feet Assistive device: None       General Gait Details: reciprocal stepping pattern, mild sway towards R (worsened with dynamic gait components) requiring cga/LE step strategy for balance recovery  Stairs            Wheelchair Mobility    Modified Rankin (Stroke Patients Only)       Balance Overall balance assessment: Needs assistance Sitting-balance support: No upper extremity supported;Feet supported Sitting balance-Leahy Scale: Good     Standing balance support: No upper extremity supported Standing balance-Leahy Scale: Fair   Single Leg Stance - Right Leg: 0 Single Leg Stance - Left Leg: 8     Rhomberg - Eyes Opened: 20 Rhomberg - Eyes Closed: 20(larger amplitude sway, but self-corrects with increased time)     Standardized Balance Assessment Standardized Balance Assessment : (Modified DGI, 8/12--mod gait deviations with head turns, direction changes in all directions)           Pertinent Vitals/Pain Pain Assessment: No/denies pain    Home Living Family/patient expects to be discharged to:: Private residence Living Arrangements: Spouse/significant other Available Help at Discharge: Family Type of Home: House Home Access: Stairs to enter Entrance Stairs-Rails: Right;Left;Can reach both Secretary/administratorntrance Stairs-Number of Steps: 3 Home Layout: One level Home Equipment: (has access to RW if needed)      Prior Function Level of Independence: Independent         Comments: Indep with ADLs, household and community mobilization without assist device; + driving, denies fall history.     Hand  Dominance   Dominant Hand: Right    Extremity/Trunk Assessment   Upper Extremity Assessment Upper Extremity Assessment: (mild coordination deficit R UE; strength and ROM grossly WFL)    Lower Extremity Assessment Lower Extremity  Assessment: (R LE at least 4/5; no significant focal weakness or sensory deficit)       Communication   Communication: No difficulties  Cognition Arousal/Alertness: Awake/alert Behavior During Therapy: WFL for tasks assessed/performed Overall Cognitive Status: Within Functional Limits for tasks assessed                                        General Comments      Exercises Other Exercises Other Exercises: Reviewed safety needs with gait trial-encourage minimization of dual task activities, external distraction with gait trials to maximize balance/safety; may consider RW for longer, community ditsances (with increased environmental stimuli).  Patient voiced understanding of all information.   Assessment/Plan    PT Assessment Patient needs continued PT services  PT Problem List Decreased strength;Decreased balance;Decreased activity tolerance;Decreased mobility;Decreased coordination       PT Treatment Interventions DME instruction;Gait training;Stair training;Functional mobility training;Therapeutic activities;Therapeutic exercise;Balance training;Neuromuscular re-education;Cognitive remediation;Patient/family education    PT Goals (Current goals can be found in the Care Plan section)  Acute Rehab PT Goals Patient Stated Goal: to see if I'm moving better PT Goal Formulation: With patient Time For Goal Achievement: 06/08/19 Potential to Achieve Goals: Good    Frequency 7X/week   Barriers to discharge        Co-evaluation               AM-PAC PT "6 Clicks" Mobility  Outcome Measure Help needed turning from your back to your side while in a flat bed without using bedrails?: None Help needed moving from lying on your back to sitting on the side of a flat bed without using bedrails?: None Help needed moving to and from a bed to a chair (including a wheelchair)?: A Little Help needed standing up from a chair using your arms (e.g., wheelchair or bedside  chair)?: A Little Help needed to walk in hospital room?: A Little Help needed climbing 3-5 steps with a railing? : A Little 6 Click Score: 20    End of Session Equipment Utilized During Treatment: Gait belt Activity Tolerance: Patient tolerated treatment well Patient left: in chair;with call bell/phone within reach;with chair alarm set Nurse Communication: Mobility status PT Visit Diagnosis: Difficulty in walking, not elsewhere classified (R26.2);Hemiplegia and hemiparesis Hemiplegia - Right/Left: Right Hemiplegia - dominant/non-dominant: Dominant Hemiplegia - caused by: Cerebral infarction    Time: 5732-2025 PT Time Calculation (min) (ACUTE ONLY): 25 min   Charges:   PT Evaluation $PT Eval Moderate Complexity: 1 Mod PT Treatments $Therapeutic Activity: 8-22 mins        Jazia Faraci H. Owens Shark, PT, DPT, NCS 05/25/19, 11:08 AM [phone]

## 2019-05-26 ENCOUNTER — Inpatient Hospital Stay: Payer: Medicare Other

## 2019-05-26 LAB — HEMOGLOBIN A1C
Hgb A1c MFr Bld: 5.4 % (ref 4.8–5.6)
Mean Plasma Glucose: 108.28 mg/dL

## 2019-05-26 MED ORDER — CLOPIDOGREL BISULFATE 75 MG PO TABS: 75.0000 mg | ORAL_TABLET | Freq: Every day | ORAL | 0 refills | Status: AC

## 2019-05-26 MED ORDER — METOPROLOL TARTRATE 25 MG PO TABS: 25.0000 mg | ORAL_TABLET | Freq: Two times a day (BID) | ORAL | 0 refills | Status: AC

## 2019-05-26 MED ORDER — AMLODIPINE BESYLATE 10 MG PO TABS: 10.0000 mg | ORAL_TABLET | Freq: Every day | ORAL | 0 refills | Status: AC

## 2019-05-26 MED ORDER — ASPIRIN 81 MG PO TBEC: 81.0000 mg | DELAYED_RELEASE_TABLET | Freq: Every day | ORAL | 0 refills | Status: AC

## 2019-05-26 MED ORDER — AMLODIPINE BESYLATE 10 MG PO TABS
10.0000 mg | ORAL_TABLET | Freq: Every day | ORAL | Status: DC
  Administered 2019-05-26 – 2019-05-27 (×2): 10 mg via ORAL
  Filled 2019-05-26 (×2): qty 1

## 2019-05-26 MED ORDER — ATORVASTATIN CALCIUM 40 MG PO TABS: 40.0000 mg | ORAL_TABLET | Freq: Every day | ORAL | 0 refills | Status: AC

## 2019-05-26 NOTE — Progress Notes (Signed)
Subjective: Patient reports that she has felt weaker on her right side today.    Objective: Current vital signs: BP (!) 176/79 (BP Location: Left Arm)   Pulse 67   Temp 97.9 F (36.6 C) (Oral)   Resp 18   Ht 5\' 1"  (1.549 m)   Wt 67.4 kg   SpO2 98%   BMI 28.08 kg/m  Vital signs in last 24 hours: Temp:  [97.9 F (36.6 C)-99.1 F (37.3 C)] 97.9 F (36.6 C) (07/02 0730) Pulse Rate:  [67-81] 67 (07/02 0730) Resp:  [18-20] 18 (07/02 0730) BP: (153-176)/(68-82) 176/79 (07/02 0730) SpO2:  [98 %-99 %] 98 % (07/02 0730)  Intake/Output from previous day: 07/01 0701 - 07/02 0700 In: 240 [P.O.:240] Out: 1100 [Urine:1100] Intake/Output this shift: Total I/O In: -  Out: 250 [Urine:250] Nutritional status:  Diet Order            Diet regular Room service appropriate? Yes; Fluid consistency: Thin  Diet effective now              Neurologic Exam: Mental Status: Alert, oriented, thought content appropriate.  Speech fluent without evidence of aphasia.  Able to follow 3 step commands without difficulty. Cranial Nerves: II: Discs flat bilaterally; Visual fields grossly normal, pupils equal, round, reactive to light and accommodation III,IV, VI: ptosis not present, extra-ocular motions intact bilaterally V,VII: smile symmetric, facial light touch sensation normal bilaterally VIII: hearing normal bilaterally IX,X: gag reflex present XI: bilateral shoulder shrug XII: midline tongue extension Motor: Right : Upper extremity   5/5    Left:     Upper extremity   5/5  Lower extremity   5-/5     Lower extremity   5/5 Some curling noted of the right fingers with pronator drift testing noted on initial examination as well.   Sensory: Pinprick and light touch intact throughout, bilaterally   Lab Results: Basic Metabolic Panel: Recent Labs  Lab 05/24/19 1500 05/24/19 1501 05/25/19 0349  NA 139 139 140  K 3.9 3.9 3.9  CL 106 104 107  CO2 21* 22 24  GLUCOSE 112* 116* 107*  BUN 20  23 25*  CREATININE 0.81 0.84 1.00  CALCIUM 8.7* 9.1 8.6*    Liver Function Tests: Recent Labs  Lab 05/24/19 1500 05/24/19 1501  AST 15 18  ALT 12 12  ALKPHOS 73 81  BILITOT 0.7 0.8  PROT 7.1 8.1  ALBUMIN 3.6 4.2   No results for input(s): LIPASE, AMYLASE in the last 168 hours. No results for input(s): AMMONIA in the last 168 hours.  CBC: Recent Labs  Lab 05/24/19 1501  WBC 12.3*  NEUTROABS 9.6*  HGB 15.0  HCT 47.3*  MCV 93.3  PLT 170    Cardiac Enzymes: No results for input(s): CKTOTAL, CKMB, CKMBINDEX, TROPONINI in the last 168 hours.  Lipid Panel: Recent Labs  Lab 05/24/19 1501  CHOL 176  TRIG 92  HDL 48  CHOLHDL 3.7  VLDL 18  LDLCALC 960110*    CBG: No results for input(s): GLUCAP in the last 168 hours.  Microbiology: Results for orders placed or performed during the hospital encounter of 05/24/19  Novel Coronavirus, NAA (hospital order; send-out to ref lab)     Status: None   Collection Time: 05/24/19  3:00 PM  Result Value Ref Range Status   SARS-CoV-2, NAA NOT DETECTED NOT DETECTED Final    Comment: (NOTE) This test was developed and its performance characteristics determined by [facility]. This test has not  been FDA cleared or approved. This test has been authorized by FDA under an Emergency Use Authorization (EUA). This test is only authorized for the duration of time the declaration that circumstances exist justifying the authorization of the emergency use of in vitro diagnostic tests for detection of SARS-CoV-2 virus and/or diagnosis of COVID-19 infection under section 564(b)(1) of the Act, 21 U.S.C. 782NFA-2(Z)(3), unless the authorization is terminated or revoked sooner. When diagnostic testing is negative, the possibility of a false negative result should be considered in the context of a patient's recent exposures and the presence of clinical signs and symptoms consistent with COVID-19. An individual without symptoms  of COVID-19 and who is not shedding SARS-CoV-2 virus would expect to have a negative (not detected) result in this assay. Performed  At: [facility] [address] [place], [place] [id number] Rush Farmer MD GE:[id number]    [place]  Final    Comment: Performed at [facility], [address] [place], [place] [address]  Urine Culture     Status: Abnormal   Collection Time: 05/24/19  3:00 PM   Specimen: Urine, Random  Result Value Ref Range Status   Specimen Description   Final    URINE, RANDOM Performed at [facility], [address], [place], [place] [address]    Special Requests   Final    NONE Performed at [facility] [address] [place], [place] [address]    Culture MULTIPLE SPECIES PRESENT, SUGGEST RECOLLECTION (A)  Final   Report Status 05/25/2019 FINAL  Final    Coagulation Studies: Recent Labs    05/24/19 1501  LABPROT 12.3  INR 0.9    Imaging: Dg Chest 2 View  Result Date: 05/24/2019 CLINICAL DATA:  Weakness. EXAM: CHEST - 2 VIEW COMPARISON:  None. FINDINGS: The heart size and mediastinal contours are within normal limits. Both lungs are clear. The visualized skeletal structures are unremarkable. IMPRESSION: No active cardiopulmonary disease. Electronically Signed   By: Marijo Conception M.D.   On: 05/24/2019 16:23   Ct Head Wo Contrast  Result Date: 05/24/2019 CLINICAL DATA:  Right arm and leg heaviness x 5 days EXAM: CT HEAD WITHOUT CONTRAST TECHNIQUE: Contiguous axial images were obtained from the base of the skull through the vertex without intravenous contrast. COMPARISON:  None. FINDINGS: Brain: No evidence of acute infarction, hemorrhage, hydrocephalus, extra-axial collection or mass lesion/mass effect. Periventricular white matter low attenuation as can be seen with microvascular disease. Vascular: No hyperdense vessel or unexpected calcification. Skull: No osseous abnormality.  Sinuses/Orbits: Visualized paranasal sinuses are clear. Visualized mastoid sinuses are clear. Visualized orbits demonstrate no focal abnormality. Other: None IMPRESSION: No acute intracranial pathology. Electronically Signed   By: Kathreen Devoid   On: 05/24/2019 16:52   Mr Brain Wo Contrast  Result Date: 05/24/2019 CLINICAL DATA:  Follow-up examination for acute stroke. Right-sided weakness. Is EXAM: MRI HEAD WITHOUT CONTRAST TECHNIQUE: Multiplanar, multiecho pulse sequences of the brain and surrounding structures were obtained without intravenous contrast. COMPARISON:  Prior CT from earlier the same day. FINDINGS: Brain: Cerebral volume within normal limits for age. Patchy T2/FLAIR hyperintensity within the periventricular and deep white matter both cerebral hemispheres most consistent with chronic small vessel ischemic disease, mild to moderate nature. 14 mm focus of restricted diffusion within the posterior left lentiform nucleus consistent with acute ischemic lacunar type infarct (series 12, image 23). No associated hemorrhage or mass effect. No other evidence for acute or subacute ischemia. Gray-white matter differentiation otherwise  maintained. No encephalomalacia to suggest chronic cortical infarction. No foci of susceptibility artifact to suggest acute or chronic intracranial hemorrhage. No mass lesion, midline shift or mass effect. No hydrocephalus. No extra-axial fluid collection. Pituitary gland suprasellar region normal. Vascular: Major intracranial vascular flow voids are maintained. Skull and upper cervical spine: Craniocervical junction within normal limits. Bone marrow signal intensity normal. No acute scalp soft tissue abnormality. 14 mm lesion protruding from the left temporal scalp noted, indeterminate, but most likely benign. Sinuses/Orbits: Globes normal soft tissues within normal limits. Paranasal sinuses are clear. No significant mastoid effusion. Inner ear structures normal. Other: None.  IMPRESSION: 1. 14 mm acute ischemic nonhemorrhagic lacunar type infarct involving the posterior left lentiform nucleus. 2. 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 Electronically Signed   By: Rise MuBenjamin  McClintock M.D.   On: 05/24/2019 23:19   [place] Carotid Bilateral  Result Date: 05/26/2019 CLINICAL DATA:  73 year old female with stroke EXAM: BILATERAL CAROTID DUPLEX ULTRASOUND TECHNIQUE: Wallace CullensGray scale imaging, color Doppler and duplex ultrasound were performed of bilateral carotid and vertebral arteries in the neck. COMPARISON:  None. FINDINGS: Criteria: Quantification of carotid stenosis is based on velocity parameters that correlate the residual internal carotid diameter with NASCET-based stenosis levels, using the diameter of the distal internal carotid lumen as the denominator for stenosis measurement. The following velocity measurements were obtained: RIGHT ICA:  Systolic 123 cm/sec, Diastolic 30 cm/sec CCA:  76 cm/sec SYSTOLIC ICA/CCA RATIO:  1.6 ECA:  131 cm/sec LEFT ICA:  Systolic 113 cm/sec, Diastolic 31 cm/sec CCA:  91 cm/sec SYSTOLIC ICA/CCA RATIO:  1.4 ECA:  83 cm/sec Right Brachial SBP: Not acquired Left Brachial SBP: Not acquired RIGHT CAROTID ARTERY: No significant calcifications of the right common carotid artery. Intermediate waveform maintained. Heterogeneous and partially calcified plaque at the right carotid bifurcation. No significant lumen shadowing. Low resistance waveform of the right ICA. No significant tortuosity. RIGHT VERTEBRAL ARTERY: Antegrade flow with low resistance waveform. LEFT CAROTID ARTERY: No significant calcifications of the left common carotid artery. Intermediate waveform maintained. Heterogeneous and partially calcified plaque at the left carotid bifurcation without significant lumen shadowing. Low resistance waveform of the left ICA. No significant tortuosity. LEFT VERTEBRAL ARTERY:  Antegrade flow with low resistance waveform. IMPRESSION: Color  duplex indicates minimal heterogeneous and calcified plaque, with no hemodynamically significant stenosis by duplex criteria in the extracranial cerebrovascular circulation. Signed, Yvone NeuJaime S. Reyne DumasWagner, DO, RPVI [facility] Electronically Signed   By: Gilmer MorJaime  Wagner D.O.   On: 05/26/2019 07:28    Medications:  I have reviewed the patient's current medications. Scheduled: . amLODipine  10 mg Oral Daily  . aspirin EC  81 mg Oral Daily  . atorvastatin  40 mg Oral q1800  . clopidogrel  75 mg Oral Daily  . enoxaparin (LOVENOX) injection  40 mg Subcutaneous Q24H  . metoprolol tartrate  25 mg Oral BID  . sodium chloride flush  3 mL Intravenous Q12H    Assessment/Plan: Patient reports worsening of right sided weakness today.  Neurological examination not significantly changed from initial evaluation.  Patient on ASA, Plavix and statin.  BP remains elevated.    Recommendations: 1. Repeat head CT without contrast.  If unchanged would continue current management.   2. BP control.  Target SBP<140.     LOS: 2 days   Thana FarrLeslie Zakayla Martinec, MD Neurology [phone] 05/26/2019  2:00 PM

## 2019-05-26 NOTE — Progress Notes (Signed)
[facility]   PATIENT NAME: Roberta Nunez    Roberta#:  [id number]  DATE OF BIRTH:  1946/10/06  SUBJECTIVE:  CHIEF COMPLAINT:   Chief Complaint  Patient presents with  . Weakness  . Hypertension   Feels more RUE weakness  REVIEW OF SYSTEMS:    Review of Systems  Constitutional: Positive for malaise/fatigue. Negative for chills and fever.  HENT: Negative for sore throat.   Eyes: Negative for blurred vision, double vision and pain.  Respiratory: Negative for cough, hemoptysis, shortness of breath and wheezing.   Cardiovascular: Negative for chest pain, palpitations, orthopnea and leg swelling.  Gastrointestinal: Negative for abdominal pain, constipation, diarrhea, heartburn, nausea and vomiting.  Genitourinary: Negative for dysuria and hematuria.  Musculoskeletal: Negative for back pain and joint pain.  Skin: Negative for rash.  Neurological: Negative for sensory change, speech change, focal weakness and headaches.  Endo/Heme/Allergies: Does not bruise/bleed easily.  Psychiatric/Behavioral: Negative for depression. The patient is not nervous/anxious.     DRUG ALLERGIES:  No Known Allergies  VITALS:  Blood pressure (!) 158/86, pulse 75, temperature 98.3 F (36.8 C), temperature source Oral, resp. rate 16, height 5\' 1"  (1.549 m), weight 67.4 kg, SpO2 94 %.  PHYSICAL EXAMINATION:   Physical Exam  GENERAL:  73 y.o.-year-old patient lying in the bed with no acute distress.  EYES: Pupils equal, round, reactive to light and accommodation. No scleral icterus. Extraocular muscles intact.  HEENT: Head atraumatic, normocephalic. Oropharynx and nasopharynx clear.  NECK:  Supple, no jugular venous distention. No thyroid enlargement, no tenderness.  LUNGS: Normal breath sounds bilaterally, no wheezing, rales, rhonchi. No use of accessory muscles of respiration.  CARDIOVASCULAR: S1, S2 normal. No murmurs, rubs, or gallops.  ABDOMEN: Soft, nontender,  nondistended. Bowel sounds present. No organomegaly or mass.  EXTREMITIES: No cyanosis, clubbing or edema b/l.    NEUROLOGIC: Cranial nerves II through XII are intact. No focal Motor or sensory deficits b/l.   PSYCHIATRIC: The patient is alert and oriented x 3.  SKIN: No obvious rash, lesion, or ulcer.   LABORATORY PANEL:   CBC Recent Labs  Lab 05/24/19 1501  WBC 12.3*  HGB 15.0  HCT 47.3*  PLT 170   ------------------------------------------------------------------------------------------------------------------ Chemistries  Recent Labs  Lab 05/24/19 1501 05/25/19 0349  NA 139 140  K 3.9 3.9  CL 104 107  CO2 22 24  GLUCOSE 116* 107*  BUN 23 25*  CREATININE 0.84 1.00  CALCIUM 9.1 8.6*  AST 18  --   ALT 12  --   ALKPHOS 81  --   BILITOT 0.8  --    ------------------------------------------------------------------------------------------------------------------  Cardiac Enzymes No results for input(s): TROPONINI in the last 168 hours. ------------------------------------------------------------------------------------------------------------------  RADIOLOGY:  Ct Head Wo Nunez  Result Date: 05/26/2019 CLINICAL DATA:  Right-sided weakness EXAM: CT HEAD WITHOUT Nunez TECHNIQUE: Contiguous axial images were obtained from the base of the skull through the vertex without intravenous Nunez. COMPARISON:  Head CT 05/24/2019 FINDINGS: Brain: Focal hypodensity of the left lentiform nucleus, adjacent to the posterior limb of the internal capsule, new compared to the prior study and likely indicating subacute infarct. Otherwise, there is periventricular hypoattenuation compatible with chronic microvascular disease. No intracranial hemorrhage or other extra-axial collection. Vascular: No abnormal hyperdensity of the major intracranial arteries or dural venous sinuses. No intracranial atherosclerosis. Skull: The visualized skull base, calvarium and extracranial soft tissues are  normal. Sinuses/Orbits: No fluid levels or advanced mucosal thickening of the visualized paranasal sinuses.  No mastoid or middle ear effusion. The orbits are normal. IMPRESSION: Early subacute small vessel infarct of the left basal ganglia and 4 nucleus in close proximity to the posterior limb of the internal capsule. This is keeping with reported right-sided weakness. No hemorrhage or mass effect. Electronically Signed   By: Deatra RobinsonKevin  Herman M.D.   On: 05/26/2019 17:26   Roberta Nunez  Result Date: 05/24/2019 CLINICAL DATA:  Follow-up examination for acute stroke. Right-sided weakness. Is EXAM: MRI HEAD WITHOUT Nunez TECHNIQUE: Multiplanar, multiecho pulse sequences of the brain and surrounding structures were obtained without intravenous Nunez. COMPARISON:  Prior CT from earlier the same day. FINDINGS: Brain: Cerebral volume within normal limits for age. Patchy T2/FLAIR hyperintensity within the periventricular and deep white matter both cerebral hemispheres most consistent with chronic small vessel ischemic disease, mild to moderate nature. 14 mm focus of restricted diffusion within the posterior left lentiform nucleus consistent with acute ischemic lacunar type infarct (series 12, image 23). No associated hemorrhage or mass effect. No other evidence for acute or subacute ischemia. Gray-white matter differentiation otherwise maintained. No encephalomalacia to suggest chronic cortical infarction. No foci of susceptibility artifact to suggest acute or chronic intracranial hemorrhage. No mass lesion, midline shift or mass effect. No hydrocephalus. No extra-axial fluid collection. Pituitary gland suprasellar region normal. Vascular: Major intracranial vascular flow voids are maintained. Skull and upper cervical spine: Craniocervical junction within normal limits. Bone marrow signal intensity normal. No acute scalp soft tissue abnormality. 14 mm lesion protruding from the left temporal scalp noted,  indeterminate, but most likely benign. Sinuses/Orbits: Globes normal soft tissues within normal limits. Paranasal sinuses are clear. No significant mastoid effusion. Inner ear structures normal. Other: None. IMPRESSION: 1. 14 mm acute ischemic nonhemorrhagic lacunar type infarct involving the posterior left lentiform nucleus. 2. 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 Electronically Signed   By: Rise MuBenjamin  McClintock M.D.   On: 05/24/2019 23:19   [place] Carotid Bilateral  Result Date: 05/26/2019 CLINICAL DATA:  73 year old female with stroke EXAM: BILATERAL CAROTID DUPLEX ULTRASOUND TECHNIQUE: Wallace CullensGray scale imaging, color Doppler and duplex ultrasound were performed of bilateral carotid and vertebral arteries in the neck. COMPARISON:  None. FINDINGS: Criteria: Quantification of carotid stenosis is based on velocity parameters that correlate the residual internal carotid diameter with NASCET-based stenosis levels, using the diameter of the distal internal carotid lumen as the denominator for stenosis measurement. The following velocity measurements were obtained: RIGHT ICA:  Systolic 123 cm/sec, Diastolic 30 cm/sec CCA:  76 cm/sec SYSTOLIC ICA/CCA RATIO:  1.6 ECA:  131 cm/sec LEFT ICA:  Systolic 113 cm/sec, Diastolic 31 cm/sec CCA:  91 cm/sec SYSTOLIC ICA/CCA RATIO:  1.4 ECA:  83 cm/sec Right Brachial SBP: Not acquired Left Brachial SBP: Not acquired RIGHT CAROTID ARTERY: No significant calcifications of the right common carotid artery. Intermediate waveform maintained. Heterogeneous and partially calcified plaque at the right carotid bifurcation. No significant lumen shadowing. Low resistance waveform of the right ICA. No significant tortuosity. RIGHT VERTEBRAL ARTERY: Antegrade flow with low resistance waveform. LEFT CAROTID ARTERY: No significant calcifications of the left common carotid artery. Intermediate waveform maintained. Heterogeneous and partially calcified plaque at the left carotid  bifurcation without significant lumen shadowing. Low resistance waveform of the left ICA. No significant tortuosity. LEFT VERTEBRAL ARTERY:  Antegrade flow with low resistance waveform. IMPRESSION: Color duplex indicates minimal heterogeneous and calcified plaque, with no hemodynamically significant stenosis by duplex criteria in the extracranial cerebrovascular circulation. Signed, Yvone NeuJaime S. Loreta AveWagner, DO, RPVI  [facility] Electronically Signed   By: Corrie Mckusick D.O.   On: 05/26/2019 07:28     ASSESSMENT AND PLAN:   73 year old female with no past medical history who presents with generalized weakness.  *Acute CVA of left lentiform nucleus Aspirin, Plavix, statin Carotid Dopplers and echocardiogram-with no significant findings  Discussed with Neuro and repeat CT head ordered for RUE weakness  *Accelerated hypertension.  Started on Norvasc.  Monitor blood pressure.  *DVT prophylaxis with Lovenox  All the records are reviewed and case discussed with Care Management/Social Workerr. Management plans discussed with the patient, family and they are in agreement.  CODE STATUS: FULL CODE  Called and updated husband over the phone  TOTAL TIME TAKING CARE OF THIS PATIENT: 35 minutes.   POSSIBLE D/C IN 1-2 DAYS, DEPENDING ON CLINICAL CONDITION.  Leia Alf Adaleigh Warf M.D on 05/26/2019 at 10:11 PM  Between 7am to 6pm - Pager - [phone]  After 6pm go to [email or web address] - password [email or web address] [facility]  Office  [phone]  CC: Primary care physician; Sallee Lange, NP  Note: This dictation was prepared with Dragon dictation along with smaller phrase technology. Any transcriptional errors that result from this process are unintentional.

## 2019-05-26 NOTE — Plan of Care (Signed)
Pt continues to exhibit mild ataxia of right arm and leg; c/o "heaviness" to right arm and leg.   Problem: Education: Goal: Knowledge of disease or condition will improve Outcome: Progressing Goal: Knowledge of secondary prevention will improve Outcome: Progressing Goal: Knowledge of patient specific risk factors addressed and post discharge goals established will improve Outcome: Progressing   Problem: Education: Goal: Knowledge of General Education information will improve Description: Including pain rating scale, medication(s)/side effects and non-pharmacologic comfort measures Outcome: Progressing   Problem: Health Behavior/Discharge Planning: Goal: Ability to manage health-related needs will improve Outcome: Progressing   Problem: Clinical Measurements: Goal: Ability to maintain clinical measurements within normal limits will improve Outcome: Progressing Goal: Will remain free from infection Outcome: Progressing Goal: Diagnostic test results will improve Outcome: Progressing Goal: Respiratory complications will improve Outcome: Progressing Goal: Cardiovascular complication will be avoided Outcome: Progressing   Problem: Activity: Goal: Risk for activity intolerance will decrease Outcome: Progressing   Problem: Nutrition: Goal: Adequate nutrition will be maintained Outcome: Progressing   Problem: Coping: Goal: Level of anxiety will decrease Outcome: Progressing   Problem: Elimination: Goal: Will not experience complications related to bowel motility Outcome: Progressing Goal: Will not experience complications related to urinary retention Outcome: Progressing   Problem: Pain Managment: Goal: General experience of comfort will improve Outcome: Progressing   Problem: Safety: Goal: Ability to remain free from injury will improve Outcome: Progressing   Problem: Skin Integrity: Goal: Risk for impaired skin integrity will decrease Outcome: Progressing

## 2019-05-26 NOTE — Progress Notes (Signed)
Physical Therapy Treatment Patient Details Name: Krysta Bloomfield MRN: [id number] DOB: 1946/05/19 Today's Date: 05/26/2019    History of Present Illness presented to ER secondary to elevated BP, generalized weakness, intermittent periods of R UE > LE weakness; admitted for management of accelerated HTN and TIA/CVA work up.  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    PT Comments    Patient received in the chair, reports she feels some increased weakness this morning. Reports some difficulty with breakfast, tried to use L UE more. Patient ambulated 300 feet in hallway with RW, min guard assist. Continues to have decreased coordination and balance with walking. Right foot catching floor on occasion, once LOB when stopping to turn around in hallway. Patient will benefit from continued skilled PT to address her decreased balance and to improve safety with mobility to return to prior functional level.        Follow Up Recommendations  Outpatient PT     Equipment Recommendations  Other (comment)(patient has RW if needed)    Recommendations for Other Services       Precautions / Restrictions Precautions Precautions: Fall Precaution Comments: mod fall Restrictions Weight Bearing Restrictions: No    Mobility  Bed Mobility                  Transfers Overall transfer level: Needs assistance Equipment used: Rolling walker (2 wheeled) Transfers: Sit to/from Stand Sit to Stand: Min guard            Ambulation/Gait Ambulation/Gait assistance: Min guard Gait Distance (Feet): 300 Feet Assistive device: Rolling walker (2 wheeled) Gait Pattern/deviations: Step-through pattern;Decreased stride length Gait velocity: decreased   General Gait Details: reciprocal stepping pattern, mild sway towards R (worsened with dynamic gait components) requiring cga/LE step strategy for balance recovery. Right foot occasionally catching floor during ambulation. One LOB when stopping to  turn around requiring min assist and wall to recover.   Stairs             Wheelchair Mobility    Modified Rankin (Stroke Patients Only)       Balance Overall balance assessment: Needs assistance Sitting-balance support: Feet supported Sitting balance-Leahy Scale: Good     Standing balance support: Bilateral upper extremity supported Standing balance-Leahy Scale: Good Standing balance comment: patient seems to have increased balance deficits from yesterday. Decreased coordination and safety with mobility.                            Cognition                                              Exercises      General Comments        Pertinent Vitals/Pain Pain Assessment: No/denies pain    Home Living Family/patient expects to be discharged to:: Private residence   Available Help at Discharge: Family Type of Home: House Home Access: Stairs to enter   Home Layout: One level        Prior Function Level of Independence: Independent      Comments: Indep with ADLs, household and community mobilization without assist device; + driving, denies fall history.   PT Goals (current goals can now be found in the care plan section) Acute Rehab PT Goals Patient Stated Goal: to see if I'm moving better PT  Goal Formulation: With patient Time For Goal Achievement: 06/08/19 Potential to Achieve Goals: Good    Frequency    7X/week      PT Plan Current plan remains appropriate    Co-evaluation              AM-PAC PT "6 Clicks" Mobility   Outcome Measure  Help needed turning from your back to your side while in a flat bed without using bedrails?: None Help needed moving from lying on your back to sitting on the side of a flat bed without using bedrails?: None Help needed moving to and from a bed to a chair (including a wheelchair)?: A Little Help needed standing up from a chair using your arms (e.g., wheelchair or bedside chair)?:  A Little Help needed to walk in hospital room?: A Little Help needed climbing 3-5 steps with a railing? : A Little 6 Click Score: 20    End of Session Equipment Utilized During Treatment: Gait belt Activity Tolerance: Patient tolerated treatment well Patient left: in chair;with chair alarm set Nurse Communication: Mobility status PT Visit Diagnosis: Difficulty in walking, not elsewhere classified (R26.2);Hemiplegia and hemiparesis Hemiplegia - Right/Left: Right Hemiplegia - dominant/non-dominant: Dominant Hemiplegia - caused by: Cerebral infarction     Time: 1045-1104 PT Time Calculation (min) (ACUTE ONLY): 19 min  Charges:  $Gait Training: 8-22 mins                     Johncarlo Maalouf, PT, GCS 05/26/19,11:19 AM

## 2019-05-26 NOTE — Evaluation (Addendum)
Occupational Therapy Evaluation Patient Details Name: Roberta Nunez MRN: [id number] DOB: 22-Aug-1946 Today's Date: 05/26/2019    History of Present Illness presented to ER secondary to elevated BP, generalized weakness, intermittent periods of R UE > LE weakness; admitted for management of accelerated HTN and TIA/CVA work up.  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   Clinical Impression   Pt seen for OT evaluation this date. Prior to hospital admission, pt was independent in all aspects of ADL/IADL and driving. Pt denies falls history in past 12 months. Pt lives with her spouse in a 1 story home with 3 steps to enter with L/R hand rails (can reach both at same time). Currently pt demonstrates mild impairments in R sided strength and coordination, and balance impairing his ability to perform mobility and ADL tasks at baseline independence. Pt endorses difficulty with self feeding using R dom hand. Pt able to perform bed mobility without assist. Pt denies visual, cognitive, and sensory deficits. Pt instructed in [facility] handout for R hand to improve her ability to independently self feed and groom. Pt able to return demo. Pt amb with RW and CGA and able to stand without UE support to brush teeth and wash hands at sink, reporting brushing her teeth felt "awkward" using her R hand due to poor coordination. Pt would benefit from additional skilled OT services to maximize recall and carryover of learned strategies as well as to maximize return to PLOF/independence, minimize falls risk, and minimize caregiver burden. Recommend OPOT services upon discharge.     Follow Up Recommendations  Outpatient OT    Equipment Recommendations  None recommended by OT    Recommendations for Other Services       Precautions / Restrictions Precautions Precautions: Fall Precaution Comments: mod fall Restrictions Weight Bearing Restrictions: No      Mobility Bed Mobility Overal bed mobility: Modified  Independent                Transfers Overall transfer level: Needs assistance Equipment used: Rolling walker (2 wheeled) Transfers: Sit to/from Stand Sit to Stand: Min guard              Balance Overall balance assessment: Needs assistance Sitting-balance support: Feet supported;Feet unsupported Sitting balance-Leahy Scale: Good     Standing balance support: Bilateral upper extremity supported;Single extremity supported;No upper extremity supported;During functional activity Standing balance-Leahy Scale: Good Standing balance comment: intermittent UE support on counter while performing grooming tasks standing at the sink                           ADL either performed or assessed with clinical judgement   ADL Overall ADL's : Needs assistance/impaired Eating/Feeding: Sitting;Set up Eating/Feeding Details (indicate cue type and reason): pt reports disatisfaction with self feeding skilled using dom R hand with increased spillage Grooming: Standing;Set up;Wash/dry face;Wash/dry hands;Oral care;Supervision/safety;Min guard Grooming Details (indicate cue type and reason): standing at sink , pt able to perform tasks with limited UE support on counter Upper Body Bathing: Sitting;Supervision/ safety;Set up   Lower Body Bathing: Sit to/from stand;Set up;Min guard   Upper Body Dressing : Sitting;Set up   Lower Body Dressing: Sit to/from stand;Set up;Min guard Lower Body Dressing Details (indicate cue type and reason): pt able to doff/don socks and shoes Toilet Transfer: RW;Ambulation;Min guard                   Vision Baseline Vision/History: Wears glasses Wears Glasses:  Reading only Patient Visual Report: No change from baseline       Perception     Praxis      Pertinent Vitals/Pain Pain Assessment: No/denies pain     Hand Dominance Right   Extremity/Trunk Assessment Upper Extremity Assessment Upper Extremity Assessment: RUE  deficits/detail RUE Deficits / Details: mild FMC and strength impairments, grossly 4+/5, functionally demonstrates mild difficulty performing tasks; denies sensory deficits RUE Coordination: decreased fine motor   Lower Extremity Assessment Lower Extremity Assessment: Overall WFL for tasks assessed(grossly 4+/5 bilaterally)   Cervical / Trunk Assessment Cervical / Trunk Assessment: Normal   Communication Communication Communication: No difficulties   Cognition Arousal/Alertness: Awake/alert Behavior During Therapy: WFL for tasks assessed/performed Overall Cognitive Status: Within Functional Limits for tasks assessed                                     General Comments       Exercises Other Exercises Other Exercises: pt educated in falls prevention strategies, participation in meaningful occupations Other Exercises: pt instructed in [facility] ex for RUE to improve strength and coordination required for ADL and IADL.   Shoulder Instructions      Home Living Family/patient expects to be discharged to:: Private residence Living Arrangements: Spouse/significant other Available Help at Discharge: Family;Available 24 hours/day Type of Home: House Home Access: Stairs to enter [employer] of Steps: 3   Home Layout: One level               Home Equipment: None   Additional Comments: has access to a RW if needed      Prior Functioning/Environment Level of Independence: Independent        Comments: Indep with ADLs, household and community mobilization without assist device; + driving, denies fall history.        OT Problem List: Decreased coordination;Decreased strength;Impaired balance (sitting and/or standing);Impaired UE functional use      OT Treatment/Interventions: Self-care/ADL training;Therapeutic exercise;Therapeutic activities;Neuromuscular education;DME and/or AE instruction;Patient/family education;Balance training    OT  Goals(Current goals can be found in the care plan section) Acute Rehab OT Goals Patient Stated Goal: return to PLOF OT Goal Formulation: With patient Time For Goal Achievement: 06/09/19 Potential to Achieve Goals: Good ADL Goals Pt Will Perform Eating: with modified independence;sitting(with >75% patient rated satisfaction) Pt Will Transfer to Toilet: with supervision;ambulating;regular height toilet(LRAD for amb) Pt/caregiver will Perform Home Exercise Program: Increased strength;Right Upper extremity;Independently;With written HEP provided([facility])  OT Frequency: Min 1X/week   Barriers to D/C:            Co-evaluation              AM-PAC OT "6 Clicks" Daily Activity     Outcome Measure Help from another person eating meals?: A Little Help from another person taking care of personal grooming?: A Little Help from another person toileting, which includes using toliet, bedpan, or urinal?: A Little Help from another person bathing (including washing, rinsing, drying)?: A Little Help from another person to put on and taking off regular upper body clothing?: A Little Help from another person to put on and taking off regular lower body clothing?: A Little 6 Click Score: 18   End of Session Equipment Utilized During Treatment: Gait belt;Rolling walker  Activity Tolerance: Patient tolerated treatment well Patient left: in bed;with call bell/phone within reach;with bed alarm set  OT Visit Diagnosis: Other abnormalities of gait  and mobility (R26.89);Hemiplegia and hemiparesis Hemiplegia - Right/Left: Right Hemiplegia - dominant/non-dominant: Dominant Hemiplegia - caused by: Cerebral infarction                Time: 1341-1422 OT Time Calculation (min): 41 min Charges:  OT General Charges $OT Visit: 1 Visit OT Evaluation $OT Eval Moderate Complexity: 1 Mod OT Treatments $Self Care/Home Management : 8-22 mins $Neuromuscular Re-education: 8-22 mins  Richrd PrimeJamie Stiller, MPH, MS,  OTR/L ascom [phone] 05/26/19, 2:54 PM

## 2019-05-26 NOTE — Care Management Important Message (Signed)
Important Message  Patient Details  Name: Roberta Nunez MRN: [id number] Date of Birth: 08/10/1946   Medicare Important Message Given:  Yes  Initial Medicare IM given by Patient Access Associate on 05/25/2019 at 11:49am. Still valid.   Dannette Barbara 05/26/2019, 2:46 PM

## 2019-05-27 MED ORDER — LISINOPRIL 10 MG PO TABS
10.0000 mg | ORAL_TABLET | Freq: Every day | ORAL | Status: DC
  Administered 2019-05-27: 14:00:00 10 mg via ORAL
  Filled 2019-05-27: qty 1

## 2019-05-27 MED ORDER — LISINOPRIL 10 MG PO TABS: 10.0000 mg | ORAL_TABLET | Freq: Every day | ORAL | 0 refills | Status: AC

## 2019-05-27 NOTE — Care Management Important Message (Signed)
Important Message  Patient Details  Name: Roberta Nunez MRN: [id number] Date of Birth: 05-21-1946   Medicare Important Message Given:  Yes     Juliann Pulse A Jeramiah Mccaughey 05/27/2019, 12:44 PM

## 2019-05-27 NOTE — Progress Notes (Signed)
Subjective: Patient continues to complain of right sided weakness.  Appears stable.    Objective: Current vital signs: BP (!) 184/80 (BP Location: Left Arm)   Pulse 65   Temp 97.7 F (36.5 C) (Oral)   Resp 16   Ht 5\' 1"  (1.549 m)   Wt 67.4 kg   SpO2 100%   BMI 28.08 kg/m  Vital signs in last 24 hours: Temp:  [97.7 F (36.5 C)-98.3 F (36.8 C)] 97.7 F (36.5 C) (07/03 0800) Pulse Rate:  [65-75] 65 (07/03 0800) Resp:  [16-18] 16 (07/03 0800) BP: (142-184)/(70-95) 184/80 (07/03 0800) SpO2:  [94 %-100 %] 100 % (07/03 0800)  Intake/Output from previous day: 07/02 0701 - 07/03 0700 In: 480 [P.O.:480] Out: 250 [Urine:250] Intake/Output this shift: No intake/output data recorded. Nutritional status:  Diet Order            Diet regular Room service appropriate? Yes; Fluid consistency: Thin  Diet effective now              Neurologic Exam: Mental Status: Alert, oriented, thought content appropriate.  Speech fluent without evidence of aphasia.  Able to follow 3 step commands without difficulty. Cranial Nerves: II: Discs flat bilaterally; Visual fields grossly normal, pupils equal, round, reactive to light and accommodation III,IV, VI: ptosis not present, extra-ocular motions intact bilaterally V,VII: smile symmetric, facial light touch sensation normal bilaterally VIII: hearing normal bilaterally IX,X: gag reflex present XI: bilateral shoulder shrug XII: midline tongue extension Motor: Right :  Upper extremity   5/5                                      Left:     Upper extremity   5/5             Lower extremity   5-/5                                                 Lower extremity   5/5 Some curling noted of the right fingers with pronator drift testing noted on initial examination as well.   Sensory: Pinprick and light touch intact throughout, bilaterally  Lab Results: Basic Metabolic Panel: Recent Labs  Lab 05/24/19 1500 05/24/19 1501 05/25/19 0349  NA 139 139  140  K 3.9 3.9 3.9  CL 106 104 107  CO2 21* 22 24  GLUCOSE 112* 116* 107*  BUN 20 23 25*  CREATININE 0.81 0.84 1.00  CALCIUM 8.7* 9.1 8.6*    Liver Function Tests: Recent Labs  Lab 05/24/19 1500 05/24/19 1501  AST 15 18  ALT 12 12  ALKPHOS 73 81  BILITOT 0.7 0.8  PROT 7.1 8.1  ALBUMIN 3.6 4.2   No results for input(s): LIPASE, AMYLASE in the last 168 hours. No results for input(s): AMMONIA in the last 168 hours.  CBC: Recent Labs  Lab 05/24/19 1501  WBC 12.3*  NEUTROABS 9.6*  HGB 15.0  HCT 47.3*  MCV 93.3  PLT 170    Cardiac Enzymes: No results for input(s): CKTOTAL, CKMB, CKMBINDEX, TROPONINI in the last 168 hours.  Lipid Panel: Recent Labs  Lab 05/24/19 1501  CHOL 176  TRIG 92  HDL 48  CHOLHDL 3.7  VLDL 18  LDLCALC 914110*    CBG:  No results for input(s): GLUCAP in the last 168 hours.  Microbiology: Results for orders placed or performed during the hospital encounter of 05/24/19  Novel Coronavirus, NAA (hospital order; send-out to ref lab)     Status: None   Collection Time: 05/24/19  3:00 PM  Result Value Ref Range Status   SARS-CoV-2, NAA NOT DETECTED NOT DETECTED Final    Comment: (NOTE) This test was developed and its performance characteristics determined by [employer]. This test has not been FDA cleared or approved. This test has been authorized by FDA under an Emergency Use Authorization (EUA). This test is only authorized for the duration of time the declaration that circumstances exist justifying the authorization of the emergency use of in vitro diagnostic tests for detection of SARS-CoV-2 virus and/or diagnosis of COVID-19 infection under section 564(b)(1) of the Act, 21 U.S.C. 017PZW-2(H)(8), unless the authorization is terminated or revoked sooner. When diagnostic testing is negative, the possibility of a false negative result should be considered in the context of a patient's recent exposures and the presence of clinical  signs and symptoms consistent with COVID-19. An individual without symptoms of COVID-19 and who is not shedding SARS-CoV-2 virus would expect to have a negative (not detected) result in this assay. Performed  At: [facility] [address] [place], [place] [id number] Rush Farmer MD [id number]    [place]  Final    Comment: Performed at [facility], [address] [place], [place] [address]  Urine Culture     Status: Abnormal   Collection Time: 05/24/19  3:00 PM   Specimen: Urine, Random  Result Value Ref Range Status   Specimen Description   Final    URINE, RANDOM Performed at [facility], [address] [place], [place] [address]    Special Requests   Final    NONE Performed at [facility], [address] [place], [place] [address]    Culture MULTIPLE SPECIES PRESENT, SUGGEST RECOLLECTION (A)  Final   Report Status 05/25/2019 FINAL  Final    Coagulation Studies: Recent Labs    05/24/19 1501  LABPROT 12.3  INR 0.9    Imaging: Ct Head Wo Contrast  Result Date: 05/26/2019 CLINICAL DATA:  Right-sided weakness EXAM: CT HEAD WITHOUT CONTRAST TECHNIQUE: Contiguous axial images were obtained from the base of the skull through the vertex without intravenous contrast. COMPARISON:  Head CT 05/24/2019 FINDINGS: Brain: Focal hypodensity of the left lentiform nucleus, adjacent to the posterior limb of the internal capsule, new compared to the prior study and likely indicating subacute infarct. Otherwise, there is periventricular hypoattenuation compatible with chronic microvascular disease. No intracranial hemorrhage or other extra-axial collection. Vascular: No abnormal hyperdensity of the major intracranial arteries or dural venous sinuses. No intracranial atherosclerosis. Skull: The visualized skull base, calvarium and extracranial soft tissues are normal. Sinuses/Orbits: No fluid levels or advanced mucosal  thickening of the visualized paranasal sinuses. No mastoid or middle ear effusion. The orbits are normal. IMPRESSION: Early subacute small vessel infarct of the left basal ganglia and 4 nucleus in close proximity to the posterior limb of the internal capsule. This is keeping with reported right-sided weakness. No hemorrhage or mass effect. Electronically Signed   By: Ulyses Jarred M.D.   On: 05/26/2019 17:26   US Carotid Bilateral  Result Date: 05/26/2019 CLINICAL DATA:  73 year old female with stroke EXAM: BILATERAL CAROTID DUPLEX ULTRASOUND TECHNIQUE: Pearline Cables scale imaging, color Doppler and duplex ultrasound were performed of bilateral carotid and  vertebral arteries in the neck. COMPARISON:  None. FINDINGS: Criteria: Quantification of carotid stenosis is based on velocity parameters that correlate the residual internal carotid diameter with NASCET-based stenosis levels, using the diameter of the distal internal carotid lumen as the denominator for stenosis measurement. The following velocity measurements were obtained: RIGHT ICA:  Systolic 123 cm/sec, Diastolic 30 cm/sec CCA:  76 cm/sec SYSTOLIC ICA/CCA RATIO:  1.6 ECA:  131 cm/sec LEFT ICA:  Systolic 113 cm/sec, Diastolic 31 cm/sec CCA:  91 cm/sec SYSTOLIC ICA/CCA RATIO:  1.4 ECA:  83 cm/sec Right Brachial SBP: Not acquired Left Brachial SBP: Not acquired RIGHT CAROTID ARTERY: No significant calcifications of the right common carotid artery. Intermediate waveform maintained. Heterogeneous and partially calcified plaque at the right carotid bifurcation. No significant lumen shadowing. Low resistance waveform of the right ICA. No significant tortuosity. RIGHT VERTEBRAL ARTERY: Antegrade flow with low resistance waveform. LEFT CAROTID ARTERY: No significant calcifications of the left common carotid artery. Intermediate waveform maintained. Heterogeneous and partially calcified plaque at the left carotid bifurcation without significant lumen shadowing. Low  resistance waveform of the left ICA. No significant tortuosity. LEFT VERTEBRAL ARTERY:  Antegrade flow with low resistance waveform. IMPRESSION: Color duplex indicates minimal heterogeneous and calcified plaque, with no hemodynamically significant stenosis by duplex criteria in the extracranial cerebrovascular circulation. Signed, Yvone NeuJaime S. Reyne DumasWagner, DO, RPVI [facility] Electronically Signed   By: Gilmer MorJaime  Wagner D.O.   On: 05/26/2019 07:28    Medications:  I have reviewed the patient's current medications. Scheduled: . amLODipine  10 mg Oral Daily  . aspirin EC  81 mg Oral Daily  . atorvastatin  40 mg Oral q1800  . clopidogrel  75 mg Oral Daily  . enoxaparin (LOVENOX) injection  40 mg Subcutaneous Q24H  . metoprolol tartrate  25 mg Oral BID  . sodium chloride flush  3 mL Intravenous Q12H    Assessment/Plan: Patient with left lacunar infarct.  Neurological examination is stable.  Patient on ASA, Plavix and statin.  BP elevated.  Recommendations: 1. Continue therapy 2. BP control 3. Continue ASA, Plavix and statin     LOS: 3 days   Thana FarrLeslie Martinique Pizzimenti, MD Neurology [phone] 05/27/2019  9:23 AM

## 2019-05-27 NOTE — Progress Notes (Signed)
Physical Therapy Treatment Patient Details Name: Roberta Nunez MRN: [id number] DOB: Dec 12, 1945 Today's Date: 05/27/2019    History of Present Illness presented to ER secondary to elevated BP, generalized weakness, intermittent periods of R UE > LE weakness; admitted for management of accelerated HTN and TIA/CVA work up.  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    PT Comments    Pt in bed,  Stated she got up this am with nursing to bathroom and felt weaker but then attributed it to being nervous.  Bed mobility without assist.  She required min verbal cues for hand placements initially but had good carryover during session.  She was able to walk around unit x 1 with walker and minor instability requiring min guard for safety.  Occasionally catches Right foot during gait.  She was fatigued upon return to room with decreased gait quality noted.  Overall speed decreased and pt generally cautious.  After rest, she was able to walk short distances in her room from bed <-> chair multiple times with emphasis on turns and safety.  Backwards gait was also stressed as she had significant difficulty 10' x 2 and needed verbal cues and min a at times for safety.    Discussed at length.  Pt has husband at home to help her.  She was encouraged to have him with her at all times for safety and she voiced understanding.  She remains at an increased risk for falls.     Follow Up Recommendations  Outpatient PT     Equipment Recommendations  3in1 (PT);Rolling walker with 5" wheels    Recommendations for Other Services       Precautions / Restrictions Precautions Precautions: Fall Precaution Comments: mod fall Restrictions Weight Bearing Restrictions: No    Mobility  Bed Mobility Overal bed mobility: Modified Independent                Transfers Overall transfer level: Needs assistance Equipment used: Rolling walker (2 wheeled) Transfers: Sit to/from Stand               Ambulation/Gait Ambulation/Gait assistance: Min guard Gait Distance (Feet): 200 Feet Assistive device: Rolling walker (2 wheeled)   Gait velocity: decreased   General Gait Details: reciprocal stepping pattern, mild sway towards R (worsened with dynamic gait components) requiring cga/LE step strategy for balance recovery. Right foot occasionally catching floor during ambulation. Occasional minor imbalances with min gaurd to recover.   Stairs             Wheelchair Mobility    Modified Rankin (Stroke Patients Only)       Balance Overall balance assessment: Needs assistance Sitting-balance support: Feet supported Sitting balance-Leahy Scale: Good     Standing balance support: Bilateral upper extremity supported Standing balance-Leahy Scale: Fair Standing balance comment: Requires walk for balance at all times with mobility                            Cognition Arousal/Alertness: Awake/alert Behavior During Therapy: WFL for tasks assessed/performed Overall Cognitive Status: Within Functional Limits for tasks assessed                                        Exercises Other Exercises Other Exercises: functional tasks with multiple sit to stand transfers and short ambulation distances to simulate household activities Other Exercises: falls  prevention and +1 assist at all times for safety    General Comments        Pertinent Vitals/Pain Pain Assessment: No/denies pain    Home Living                      Prior Function            PT Goals (current goals can now be found in the care plan section) Progress towards PT goals: Progressing toward goals    Frequency    7X/week      PT Plan Current plan remains appropriate    Co-evaluation              AM-PAC PT "6 Clicks" Mobility   Outcome Measure  Help needed turning from your back to your side while in a flat bed without using bedrails?: None Help needed  moving from lying on your back to sitting on the side of a flat bed without using bedrails?: None Help needed moving to and from a bed to a chair (including a wheelchair)?: A Little Help needed standing up from a chair using your arms (e.g., wheelchair or bedside chair)?: A Little Help needed to walk in hospital room?: A Little Help needed climbing 3-5 steps with a railing? : A Little 6 Click Score: 20    End of Session Equipment Utilized During Treatment: Gait belt Activity Tolerance: Patient tolerated treatment well;Patient limited by fatigue Patient left: in chair;with call bell/phone within reach;with chair alarm set   Hemiplegia - Right/Left: Right Hemiplegia - dominant/non-dominant: Dominant Hemiplegia - caused by: Cerebral infarction     Time: [id number] PT Time Calculation (min) (ACUTE ONLY): 24 min  Charges:  $Gait Training: 8-22 mins $Therapeutic Activity: 8-22 mins                     Danielle DessSarah Kharlie Bring, PTA 05/27/19, 9:43 AM

## 2019-06-01 DIAGNOSIS — R531 Weakness: Secondary | ICD-10-CM | POA: Insufficient documentation

## 2019-06-01 DIAGNOSIS — F4322 Adjustment disorder with anxiety: Secondary | ICD-10-CM | POA: Insufficient documentation

## 2019-06-01 DIAGNOSIS — I6381 Other cerebral infarction due to occlusion or stenosis of small artery: Secondary | ICD-10-CM | POA: Insufficient documentation

## 2019-06-07 NOTE — Discharge Summary (Signed)
[facility] - [place] [facility]   PATIENT NAME: Roberta Nunez    MR#:  [id number]  DATE OF BIRTH:  [birth year]  DATE OF ADMISSION:  05/24/2019 ADMITTING PHYSICIAN: Adrian SaranSital Mody, MD  DATE OF DISCHARGE: 05/27/2019  7:09 PM  PRIMARY CARE PHYSICIAN: Bayard MalesGauger, Hermenia FiscalSarah Kathryn, NP   ADMISSION DIAGNOSIS:  TIA (transient ischemic attack) [G45.9] Cerebral infarction (HCC) [I63.9] CVA (cerebral infarction) [I63.9] Hypertensive urgency [I16.0] New onset left bundle branch block (LBBB) [I44.7]  DISCHARGE DIAGNOSIS:  Active Problems:   HTN (hypertension)   SECONDARY DIAGNOSIS:  History reviewed. No pertinent past medical history.   ADMITTING HISTORY  Roberta Nunez  is a 73 y.o. female with no past medical history who presents from home due to elevated blood pressure and generalized weakness.  Patient reports that Friday she had an episode of right leg weakness and right arm weakness.  It subsided fairly shortly approximately 5 minutes.  She had no other focal neurological deficits.  Patient reports that she just did not feel well over the weekend.  She presents today due to the symptoms.  In the emergency room she is noted to have a new left bundle branch block and elevated blood pressure systolic in the 200s.  She reports she does not have a history of hypertension nor has she taken any medications. She denies fever, chills, chest pain, PND or shortness of breath.  HOSPITAL COURSE:   73 year old female with no past medical history who presents with generalized weakness.  *Acute CVA of left lentiform nucleus Aspirin, Plavix, statin Carotid Dopplers and echocardiogram-with no significant findings  Discussed with Neuro and repeat CT head ordered for RUE weakness.  Stable findings of recent stroke  *Accelerated hypertension.  Started on Norvasc.  Monitor blood pressure. Blood pressure slowly improving.  Follow-up with primary care physician.  *DVT prophylaxis with Lovenox  in the hospital  Patient stable for discharge home with home health services   CONSULTS OBTAINED:   Neurology DRUG ALLERGIES:  No Known Allergies  DISCHARGE MEDICATIONS:   Allergies as of 05/27/2019   No Known Allergies     Medication List    TAKE these medications   acetaminophen 325 MG tablet Commonly known as: TYLENOL Take 650 mg by mouth every 6 (six) hours as needed.   amLODipine 10 MG tablet Commonly known as: NORVASC Take 1 tablet (10 mg total) by mouth daily.   aspirin 81 MG EC tablet Take 1 tablet (81 mg total) by mouth daily.   atorvastatin 40 MG tablet Commonly known as: LIPITOR Take 1 tablet (40 mg total) by mouth daily at 6 PM.   clopidogrel 75 MG tablet Commonly known as: PLAVIX Take 1 tablet (75 mg total) by mouth daily.   lisinopril 10 MG tablet Commonly known as: ZESTRIL Take 1 tablet (10 mg total) by mouth daily.   metoprolol tartrate 25 MG tablet Commonly known as: LOPRESSOR Take 1 tablet (25 mg total) by mouth 2 (two) times daily.       Today   VITAL SIGNS:  Blood pressure (!) 151/61, pulse 71, temperature 97.9 F (36.6 C), temperature source Oral, resp. rate 18, height 5\' 1"  (1.549 m), weight 67.4 kg, SpO2 99 %.  I/O:  No intake or output data in the 24 hours ending 06/07/19 1639  PHYSICAL EXAMINATION:  Physical Exam  GENERAL:  73 y.o.-year-old patient lying in the bed with no acute distress.  LUNGS: Normal breath sounds bilaterally, no wheezing, rales,rhonchi or crepitation. No use of accessory  muscles of respiration.  CARDIOVASCULAR: S1, S2 normal. No murmurs, rubs, or gallops.  ABDOMEN: Soft, non-tender, non-distended. Bowel sounds present. No organomegaly or mass.  NEUROLOGIC: Moves all 4 extremities. PSYCHIATRIC: The patient is alert and oriented x 3.  SKIN: No obvious rash, lesion, or ulcer.   DATA REVIEW:   CBC No results for input(s): WBC, HGB, HCT, PLT in the last 168 hours.  Chemistries  No results for input(s):  NA, K, CL, CO2, GLUCOSE, BUN, CREATININE, CALCIUM, MG, AST, ALT, ALKPHOS, BILITOT in the last 168 hours.  Invalid input(s): GFRCGP  Cardiac Enzymes No results for input(s): TROPONINI in the last 168 hours.  Microbiology Results  Results for orders placed or performed during the hospital encounter of 05/24/19  Novel Coronavirus, NAA (hospital order; send-out to ref lab)     Status: None   Collection Time: 05/24/19  3:00 PM  Result Value Ref Range Status   SARS-CoV-2, NAA NOT DETECTED NOT DETECTED Final    Comment: (NOTE) This test was developed and its performance characteristics determined by [employer]. This test has not been FDA cleared or approved. This test has been authorized by FDA under an Emergency Use Authorization (EUA). This test is only authorized for the duration of time the declaration that circumstances exist justifying the authorization of the emergency use of in vitro diagnostic tests for detection of SARS-CoV-2 virus and/or diagnosis of COVID-19 infection under section 564(b)(1) of the Act, 21 U.S.C. 381OFB-5(Z)(0), unless the authorization is terminated or revoked sooner. When diagnostic testing is negative, the possibility of a false negative result should be considered in the context of a patient's recent exposures and the presence of clinical signs and symptoms consistent with COVID-19. An individual without symptoms of COVID-19 and who is not shedding SARS-CoV-2 virus would expect to have a negative (not detected) result in this assay. Performed  At: [facility] [address] [place], [place] [id number] Rush Farmer MD [id number]    [place]  Final    Comment: Performed at [facility], [address] [place], [place] [address]  Urine Culture     Status: Abnormal   Collection Time: 05/24/19  3:00 PM   Specimen: Urine, Random  Result Value Ref Range Status   Specimen Description   Final     URINE, RANDOM Performed at [facility], [address] [place], [place] [address]    Special Requests   Final    NONE Performed at [facility], [address] [place], [place] [address]    Culture MULTIPLE SPECIES PRESENT, SUGGEST RECOLLECTION (A)  Final   Report Status 05/25/2019 FINAL  Final    RADIOLOGY:  No results found.  Follow up with PCP in 1 week.  Management plans discussed with the patient, family and they are in agreement.  CODE STATUS:  Code Status History    Date Active Date Inactive Code Status Order ID Comments User Context   05/24/2019 1711 05/27/2019 2209 Full Code [id number]  Bettey Costa, MD ED   Advance Care Planning Activity      TOTAL TIME TAKING CARE OF THIS PATIENT ON DAY OF DISCHARGE: more than 30 minutes.   Neita Carp M.D on 06/07/2019 at 4:39 PM  Between 7am to 6pm - Pager - [phone]  After 6pm go to [email or web address] - password [email or web address]  Office  [phone]  CC: Primary care physician; Sallee Lange, NP  Note: This dictation was prepared with Viviann Spare  dictation along with smaller phrase technology. Any transcriptional errors that result from this process are unintentional.

## 2019-06-14 DIAGNOSIS — M792 Neuralgia and neuritis, unspecified: Secondary | ICD-10-CM | POA: Insufficient documentation

## 2019-06-15 DIAGNOSIS — E538 Deficiency of other specified B group vitamins: Secondary | ICD-10-CM | POA: Insufficient documentation

## 2019-06-15 DIAGNOSIS — E559 Vitamin D deficiency, unspecified: Secondary | ICD-10-CM | POA: Insufficient documentation

## 2019-06-16 DIAGNOSIS — Z8673 Personal history of transient ischemic attack (TIA), and cerebral infarction without residual deficits: Secondary | ICD-10-CM | POA: Insufficient documentation

## 2019-12-05 IMAGING — CT CT HEAD WITHOUT CONTRAST
3 series · 16 of 47 positions shown, 19 images · non-contrast
Comparison: Head CT 05/24/2019

CLINICAL DATA: Right-sided weakness

EXAM:
CT HEAD WITHOUT CONTRAST
TECHNIQUE: Contiguous axial images were obtained from the base of the skull
through the vertex without intravenous contrast.

[Series 2: head wo · axial · 0.47mm/px · z∈[-150,-25]mm · 10 of 30 slices shown, 13 images]
[im 3/30  brain]
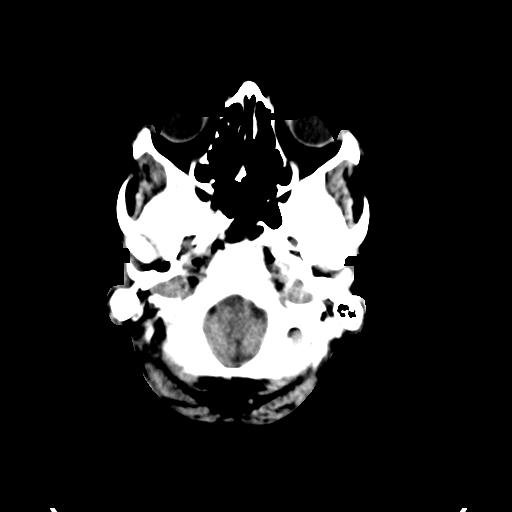
[im 3/30  bone]
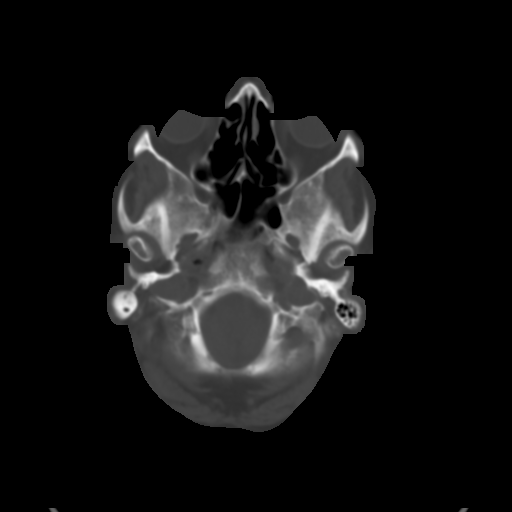
[im 6/30  brain]
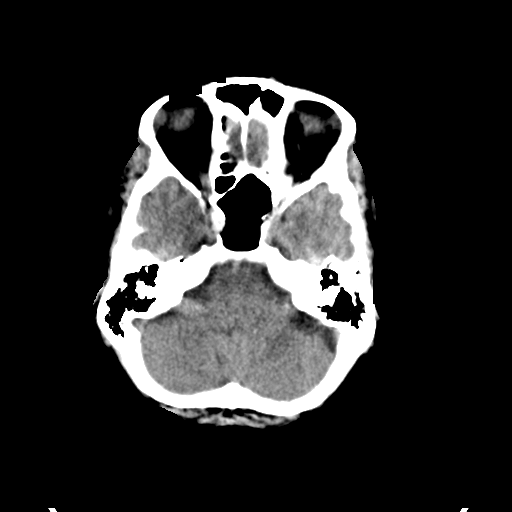
[im 9/30  brain]
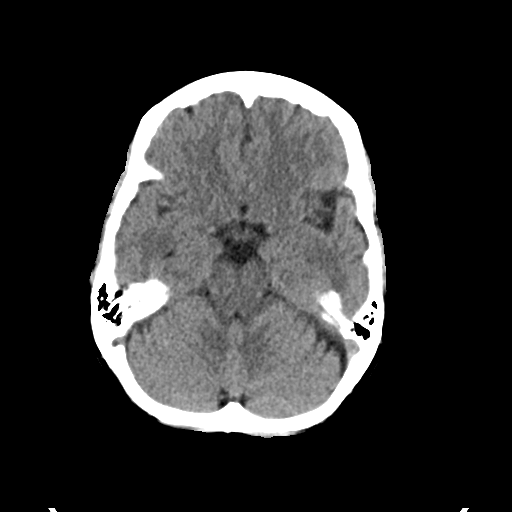
[im 11/30  brain]
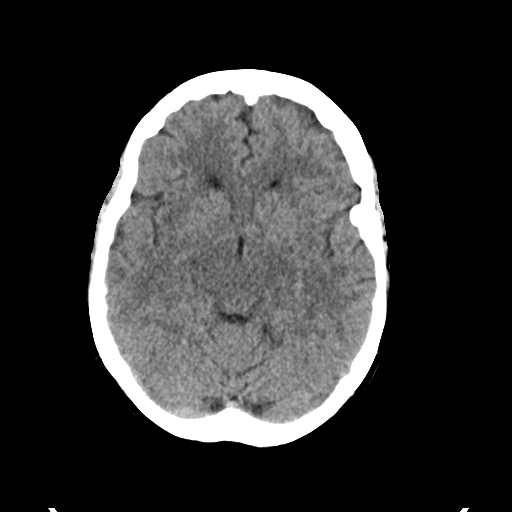
[im 14/30  brain]
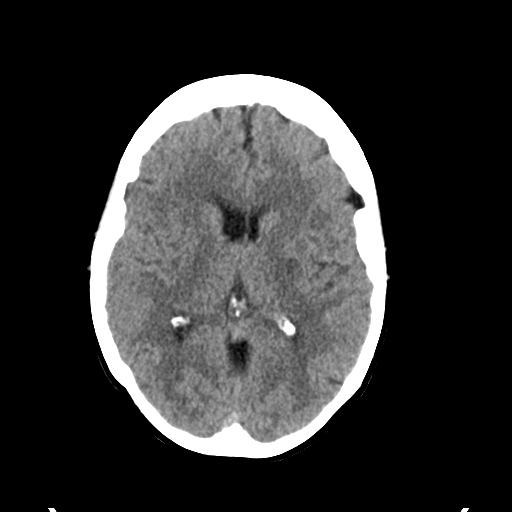
[im 14/30  bone]
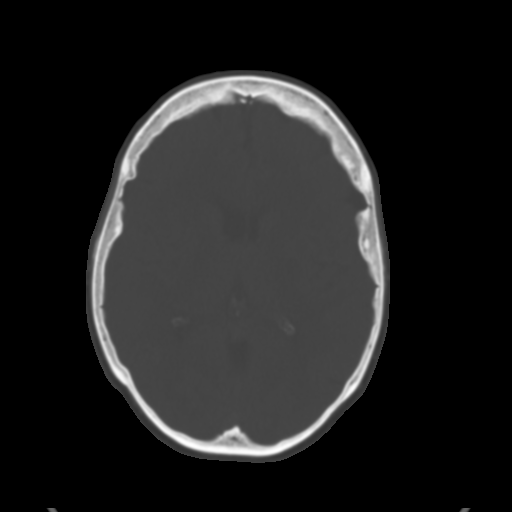
[im 17/30  brain]
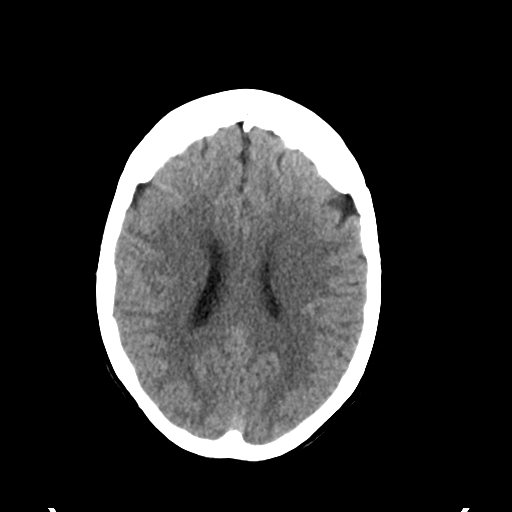
[im 20/30  brain]
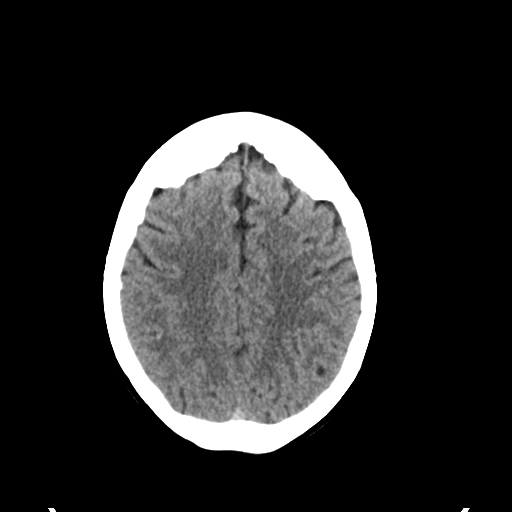
[im 23/30  brain]
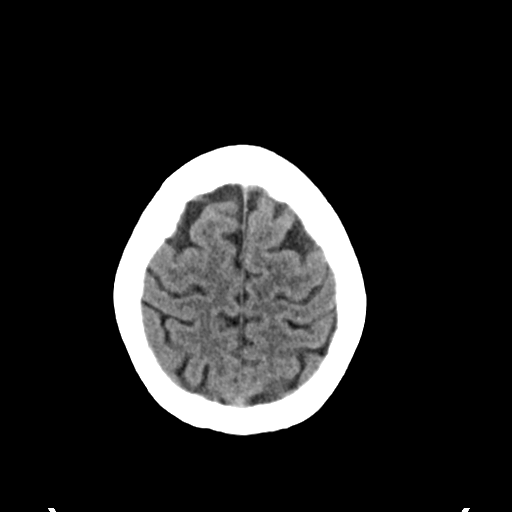
[im 25/30  brain]
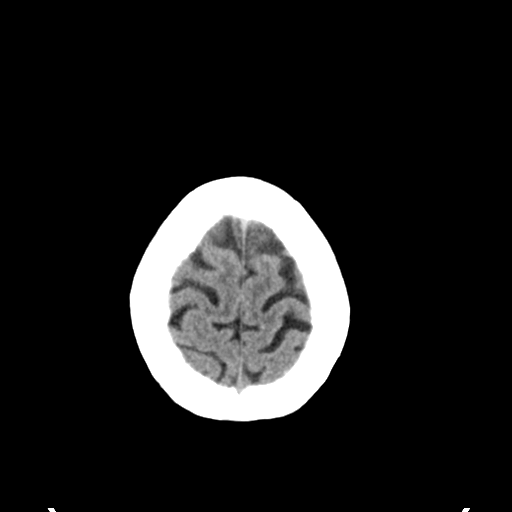
[im 25/30  bone]
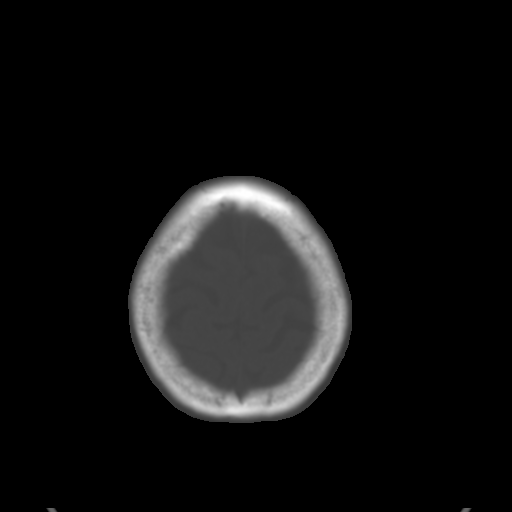
[im 28/30  brain]
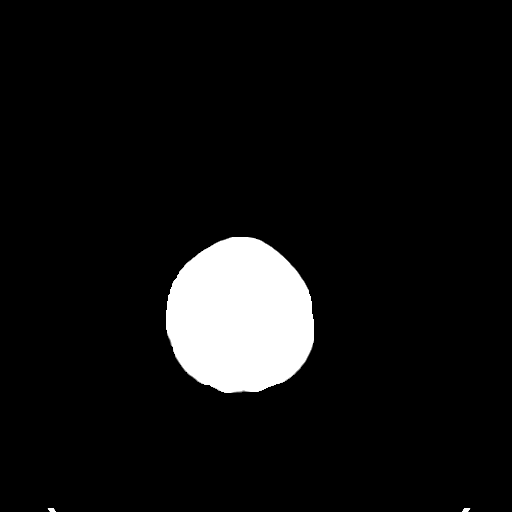

[Series 4: coronal soft tissue · coronal · 0.31mm/px · 3 of 62 slices shown]
[im 21/62  brain]
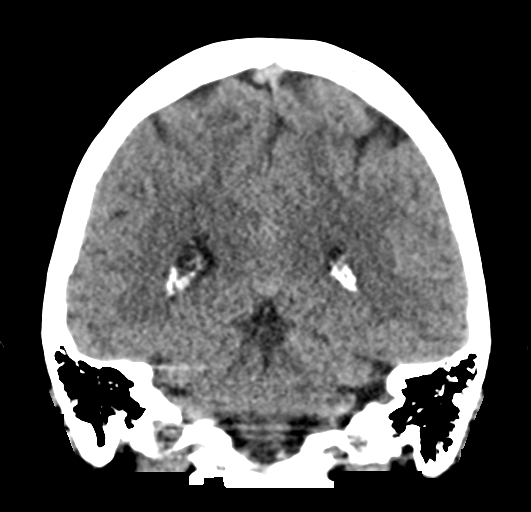
[im 28/62  brain]
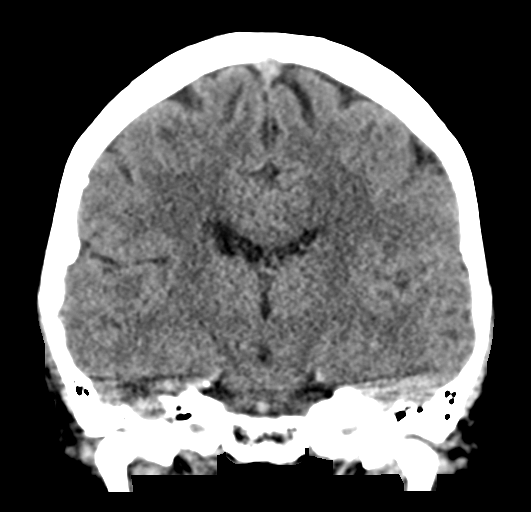
[im 34/62  brain]
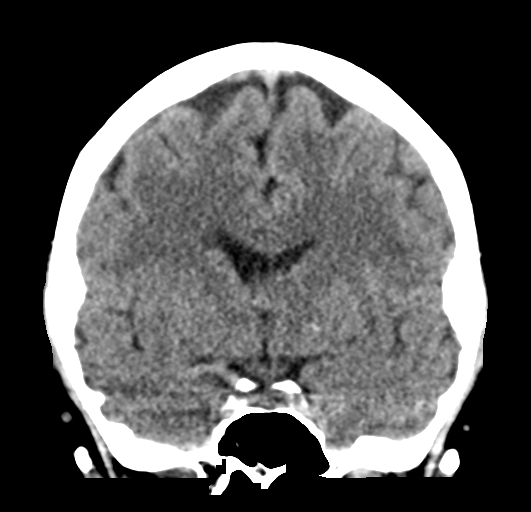

[Series 5: sagittal soft tissue · sagittal · 0.31mm/px · 3 of 54 slices shown]
[im 18/54  brain]
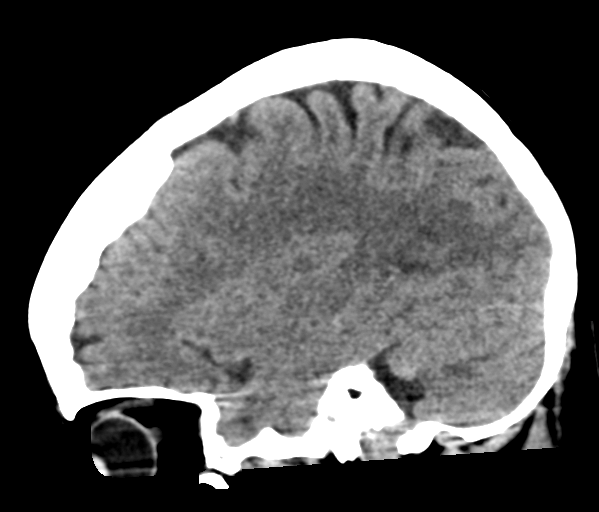
[im 27/54  brain]
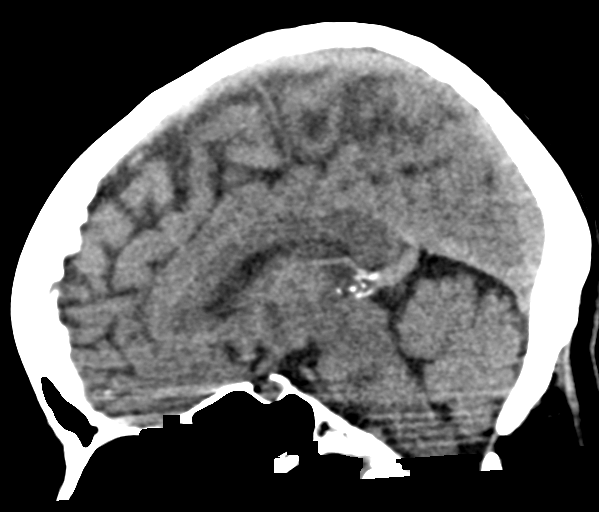
[im 36/54  brain]
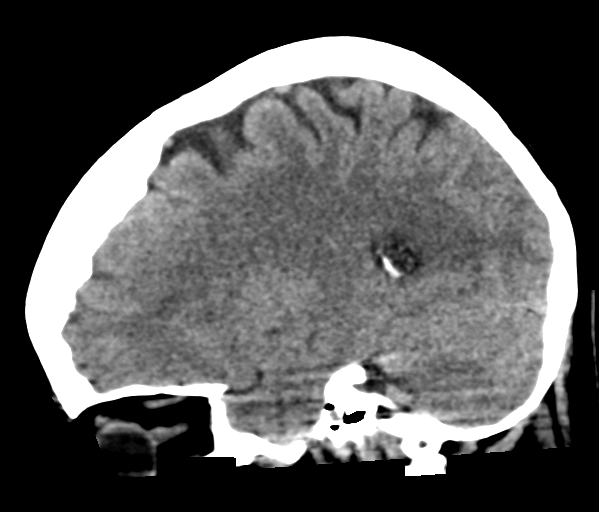

[16 of 47 positions shown; findings below may reference images not displayed]

FINDINGS: Brain: Focal hypodensity of the left lentiform nucleus, adjacent to
the posterior limb of the internal capsule, new compared to the
prior study and likely indicating subacute infarct. Otherwise, there
is periventricular hypoattenuation compatible with chronic
microvascular disease. No intracranial hemorrhage or other
extra-axial collection.

Vascular: No abnormal hyperdensity of the major intracranial
arteries or dural venous sinuses. No intracranial atherosclerosis.

Skull: The visualized skull base, calvarium and extracranial soft
tissues are normal.

Sinuses/Orbits: No fluid levels or advanced mucosal thickening of
the visualized paranasal sinuses. No mastoid or middle ear effusion.
The orbits are normal.
IMPRESSION: Early subacute small vessel infarct of the left basal ganglia and 4
nucleus in close proximity to the posterior limb of the internal
capsule. This is keeping with reported right-sided weakness. No
hemorrhage or mass effect.

## 2019-12-27 DIAGNOSIS — R29898 Other symptoms and signs involving the musculoskeletal system: Secondary | ICD-10-CM | POA: Insufficient documentation

## 2019-12-27 DIAGNOSIS — R5382 Chronic fatigue, unspecified: Secondary | ICD-10-CM | POA: Insufficient documentation

## 2020-01-05 ENCOUNTER — Other Ambulatory Visit: Payer: Self-pay

## 2020-01-05 ENCOUNTER — Ambulatory Visit (INDEPENDENT_AMBULATORY_CARE_PROVIDER_SITE_OTHER): Payer: Medicare Other | Admitting: Vascular Surgery

## 2020-01-05 ENCOUNTER — Encounter (INDEPENDENT_AMBULATORY_CARE_PROVIDER_SITE_OTHER): Payer: Self-pay | Admitting: Vascular Surgery

## 2020-01-05 ENCOUNTER — Encounter (INDEPENDENT_AMBULATORY_CARE_PROVIDER_SITE_OTHER): Payer: Self-pay

## 2020-01-05 VITALS — BP 137/78 | HR 67 | Resp 12 | Ht 61.0 in | Wt 160.0 lb

## 2020-01-05 DIAGNOSIS — M79604 Pain in right leg: Secondary | ICD-10-CM

## 2020-01-05 DIAGNOSIS — M79605 Pain in left leg: Secondary | ICD-10-CM | POA: Diagnosis not present

## 2020-01-05 DIAGNOSIS — I10 Essential (primary) hypertension: Secondary | ICD-10-CM

## 2020-01-12 ENCOUNTER — Encounter (INDEPENDENT_AMBULATORY_CARE_PROVIDER_SITE_OTHER): Payer: Self-pay | Admitting: Vascular Surgery

## 2020-01-12 DIAGNOSIS — M79606 Pain in leg, unspecified: Secondary | ICD-10-CM | POA: Insufficient documentation

## 2020-01-12 NOTE — Progress Notes (Signed)
MRN : [id number]  Roberta Nunez is a 74 y.o. (1946-05-01) female who presents with chief complaint of  Chief Complaint  Patient presents with  . New Patient (Initial Visit)    [place]. Leg pain  .  History of Present Illness:   The patient is seen for evaluation of painful lower extremities. Patient notes the pain is variable and not always associated with activity.  The pain is somewhat consistent day to day occurring on most days. The patient notes the pain also occurs with standing and routinely seems worse as the day wears on. The pain has been progressive over the past several years. The patient states these symptoms are causing  a profound negative impact on quality of life and daily activities.  The patient denies rest pain or dangling of an extremity off the side of the bed during the night for relief. No open wounds or sores at this time. No history of DVT or phlebitis. No prior interventions or surgeries.   Current Meds  Medication Sig  . acetaminophen (TYLENOL) 325 MG tablet Take 650 mg by mouth every 6 (six) hours as needed.  Marland Kitchen amLODipine (NORVASC) 10 MG tablet Take 1 tablet (10 mg total) by mouth daily.  Marland Kitchen aspirin EC 81 MG EC tablet Take 1 tablet (81 mg total) by mouth daily.  Marland Kitchen atorvastatin (LIPITOR) 40 MG tablet Take 1 tablet (40 mg total) by mouth daily at 6 PM.  . Cholecalciferol 50 MCG (2000 UT) CAPS Take 3 capsules daily for 3 months, then reduce to 1 capsule daily thereafter for Vitamin D Deficiency.  . clopidogrel (PLAVIX) 75 MG tablet Take 1 tablet (75 mg total) by mouth daily.  . cyanocobalamin 1000 MCG tablet Take 2 tablets daily for 2 weeks, then reduce to 1 tablet daily thereafter for Vitamin B12 Deficiency.  Marland Kitchen lisinopril (ZESTRIL) 10 MG tablet Take 1 tablet (10 mg total) by mouth daily.  Marland Kitchen losartan (COZAAR) 25 MG tablet Take by mouth.  . metoprolol tartrate (LOPRESSOR) 25 MG tablet Take 1 tablet (25 mg total) by mouth 2 (two) times daily.    Past  Medical History:  Diagnosis Date  . Stroke [facility])     History reviewed. No pertinent surgical history.  Social History Social History   Tobacco Use  . Smoking status: Never Smoker  . Smokeless tobacco: Never Used  Substance Use Topics  . Alcohol use: Not on file  . Drug use: Not on file    Family History Family History  Problem Relation Age of Onset  . Cancer Father   No family history of bleeding/clotting disorders, porphyria or autoimmune disease   No Known Allergies   REVIEW OF SYSTEMS (Negative unless checked)  Constitutional: [] Weight loss  [] Fever  [] Chills Cardiac: [] Chest pain   [] Chest pressure   [] Palpitations   [] Shortness of breath when laying flat   [] Shortness of breath with exertion. Vascular:  [] Pain in legs with walking   [x] Pain in legs at rest  [] History of DVT   [] Phlebitis   [] Swelling in legs   [] Varicose veins   [] Non-healing ulcers Pulmonary:   [] Uses home oxygen   [] Productive cough   [] Hemoptysis   [] Wheeze  [] COPD   [] Asthma Neurologic:  [] Dizziness   [] Seizures   [] History of stroke   [] History of TIA  [] Aphasia   [] Vissual changes   [] Weakness or numbness in arm   [] Weakness or numbness in leg Musculoskeletal:   [] Joint swelling   [] Joint pain   []   Low back pain Hematologic:  [] Easy bruising  [] Easy bleeding   [] Hypercoagulable state   [] Anemic Gastrointestinal:  [] Diarrhea   [] Vomiting  [] Gastroesophageal reflux/heartburn   [] Difficulty swallowing. Genitourinary:  [] Chronic kidney disease   [] Difficult urination  [] Frequent urination   [] Blood in urine Skin:  [] Rashes   [] Ulcers  Psychological:  [] History of anxiety   []  History of major depression.  Physical Examination  Vitals:   01/05/20 1131  BP: 137/78  Pulse: 67  Resp: 12  Weight: 160 lb (72.6 kg)  Height: 5\' 1"  (1.549 m)   Body mass index is 30.23 kg/m. Gen: WD/WN, NAD Head: [place]/AT, No temporalis wasting.  Ear/Nose/Throat: Hearing grossly intact, nares w/o erythema or drainage,  poor dentition Eyes: PER, EOMI, sclera nonicteric.  Neck: Supple, no masses.  No bruit or JVD.  Pulmonary:  Good air movement, clear to auscultation bilaterally, no use of accessory muscles.  Cardiac: RRR, normal S1, S2, no Murmurs. Vascular: trace edema Vessel Right Left  Radial Palpable Palpable  PT Palpable Palpable  DP Palpable Palpable  Gastrointestinal: soft, non-distended. No guarding/no peritoneal signs.  Musculoskeletal: M/S 5/5 throughout.  No deformity or atrophy.  Neurologic: CN 2-12 intact. Pain and light touch intact in extremities.  Symmetrical.  Speech is fluent. Motor exam as listed above. Psychiatric: Judgment intact, Mood & affect appropriate for pt's clinical situation. Dermatologic: No rashes or ulcers noted.  No changes consistent with cellulitis.  CBC Lab Results  Component Value Date   WBC 12.3 (H) 05/24/2019   HGB 15.0 05/24/2019   HCT 47.3 (H) 05/24/2019   MCV 93.3 05/24/2019   PLT 170 05/24/2019    BMET    Component Value Date/Time   NA 140 05/25/2019 0349   K 3.9 05/25/2019 0349   CL 107 05/25/2019 0349   CO2 24 05/25/2019 0349   GLUCOSE 107 (H) 05/25/2019 0349   BUN 25 (H) 05/25/2019 0349   CREATININE 1.00 05/25/2019 0349   CALCIUM 8.6 (L) 05/25/2019 0349   GFRNONAA 57 (L) 05/25/2019 0349   GFRAA >60 05/25/2019 0349   CrCl cannot be calculated (Patient's most recent lab result is older than the maximum 21 days allowed.).  COAG Lab Results  Component Value Date   INR 0.9 05/24/2019    Radiology No results found.   Assessment/Plan 1. Pain in both lower extremities Recommend:  I do not find evidence of Vascular pathology that would explain the patient's symptoms  The patient has atypical pain symptoms for vascular disease  I do not find evidence of Vascular pathology that would explain the patient's symptoms and I suspect the patient is c/o pseudoclaudication.  Patient should have an evaluation of his LS spine which I defer to the  primary service.  Noninvasive studies including venous ultrasound of the legs do not identify vascular problems  The patient should continue walking and begin a more formal exercise program. The patient should continue his antiplatelet therapy and aggressive treatment of the lipid abnormalities. The patient should begin wearing graduated compression socks 15-20 mmHg strength to control her mild edema.  Patient will follow-up with me on a PRN basis  Further work-up of her lower extremity pain is deferred to the primary service     2. Essential hypertension Continue antihypertensive medications as already ordered, these medications have been reviewed and there are no changes at this time.      Hortencia Pilar, MD  01/12/2020 12:21 PM

## 2022-09-22 ENCOUNTER — Encounter (INDEPENDENT_AMBULATORY_CARE_PROVIDER_SITE_OTHER): Payer: Self-pay
